# Patient Record
Sex: Female | Born: 1975 | Race: White | Hispanic: No | Marital: Married | Smoking: Never smoker
Health system: Southern US, Community
[De-identification: ages and names within clinical notes are randomized; demographics above are authoritative.]

## PROBLEM LIST (undated history)

## (undated) DIAGNOSIS — M329 Systemic lupus erythematosus, unspecified: Secondary | ICD-10-CM

## (undated) DIAGNOSIS — IMO0002 Reserved for concepts with insufficient information to code with codable children: Secondary | ICD-10-CM

## (undated) HISTORY — PX: ABDOMINAL SURGERY: SHX537

## (undated) HISTORY — PX: HERNIA REPAIR: SHX51

## (undated) HISTORY — PX: ABDOMINAL HYSTERECTOMY: SHX81

---

## 2019-08-04 HISTORY — PX: PACEMAKER IMPLANT: EP1218

## 2021-02-19 ENCOUNTER — Emergency Department (HOSPITAL_COMMUNITY): Payer: Medicare Other

## 2021-02-19 ENCOUNTER — Inpatient Hospital Stay (HOSPITAL_COMMUNITY)
Admission: EM | Admit: 2021-02-19 | Discharge: 2021-02-28 | DRG: 552 | Disposition: A | Discharge status: Home / Self Care | Payer: Medicare Other | Attending: Internal Medicine | Admitting: Internal Medicine

## 2021-02-19 ENCOUNTER — Encounter (HOSPITAL_COMMUNITY): Payer: Self-pay | Admitting: Emergency Medicine

## 2021-02-19 ENCOUNTER — Other Ambulatory Visit: Payer: Self-pay

## 2021-02-19 DIAGNOSIS — M25559 Pain in unspecified hip: Secondary | ICD-10-CM

## 2021-02-19 DIAGNOSIS — K219 Gastro-esophageal reflux disease without esophagitis: Secondary | ICD-10-CM

## 2021-02-19 DIAGNOSIS — E669 Obesity, unspecified: Secondary | ICD-10-CM | POA: Diagnosis present

## 2021-02-19 DIAGNOSIS — R8271 Bacteriuria: Secondary | ICD-10-CM | POA: Diagnosis present

## 2021-02-19 DIAGNOSIS — M4834 Traumatic spondylopathy, thoracic region: Secondary | ICD-10-CM | POA: Diagnosis present

## 2021-02-19 DIAGNOSIS — F250 Schizoaffective disorder, bipolar type: Secondary | ICD-10-CM

## 2021-02-19 DIAGNOSIS — D61818 Other pancytopenia: Secondary | ICD-10-CM | POA: Diagnosis present

## 2021-02-19 DIAGNOSIS — Z79899 Other long term (current) drug therapy: Secondary | ICD-10-CM

## 2021-02-19 DIAGNOSIS — Z885 Allergy status to narcotic agent status: Secondary | ICD-10-CM

## 2021-02-19 DIAGNOSIS — Z9109 Other allergy status, other than to drugs and biological substances: Secondary | ICD-10-CM

## 2021-02-19 DIAGNOSIS — Z20822 Contact with and (suspected) exposure to covid-19: Secondary | ICD-10-CM | POA: Diagnosis present

## 2021-02-19 DIAGNOSIS — R29898 Other symptoms and signs involving the musculoskeletal system: Secondary | ICD-10-CM | POA: Diagnosis present

## 2021-02-19 DIAGNOSIS — Z882 Allergy status to sulfonamides status: Secondary | ICD-10-CM

## 2021-02-19 DIAGNOSIS — R42 Dizziness and giddiness: Secondary | ICD-10-CM | POA: Diagnosis present

## 2021-02-19 DIAGNOSIS — Z6835 Body mass index (BMI) 35.0-35.9, adult: Secondary | ICD-10-CM

## 2021-02-19 DIAGNOSIS — M4836 Traumatic spondylopathy, lumbar region: Secondary | ICD-10-CM | POA: Diagnosis not present

## 2021-02-19 DIAGNOSIS — R45851 Suicidal ideations: Secondary | ICD-10-CM | POA: Diagnosis present

## 2021-02-19 DIAGNOSIS — Z880 Allergy status to penicillin: Secondary | ICD-10-CM

## 2021-02-19 DIAGNOSIS — R109 Unspecified abdominal pain: Secondary | ICD-10-CM

## 2021-02-19 DIAGNOSIS — W2103XA Struck by baseball, initial encounter: Secondary | ICD-10-CM

## 2021-02-19 DIAGNOSIS — T7491XA Unspecified adult maltreatment, confirmed, initial encounter: Secondary | ICD-10-CM | POA: Diagnosis present

## 2021-02-19 DIAGNOSIS — R001 Bradycardia, unspecified: Secondary | ICD-10-CM | POA: Diagnosis present

## 2021-02-19 DIAGNOSIS — F4310 Post-traumatic stress disorder, unspecified: Secondary | ICD-10-CM | POA: Diagnosis present

## 2021-02-19 DIAGNOSIS — M5116 Intervertebral disc disorders with radiculopathy, lumbar region: Secondary | ICD-10-CM | POA: Diagnosis present

## 2021-02-19 DIAGNOSIS — G8929 Other chronic pain: Secondary | ICD-10-CM | POA: Diagnosis present

## 2021-02-19 DIAGNOSIS — M4804 Spinal stenosis, thoracic region: Secondary | ICD-10-CM | POA: Diagnosis present

## 2021-02-19 DIAGNOSIS — R079 Chest pain, unspecified: Secondary | ICD-10-CM

## 2021-02-19 DIAGNOSIS — R7989 Other specified abnormal findings of blood chemistry: Secondary | ICD-10-CM | POA: Diagnosis present

## 2021-02-19 DIAGNOSIS — E1140 Type 2 diabetes mellitus with diabetic neuropathy, unspecified: Secondary | ICD-10-CM | POA: Diagnosis present

## 2021-02-19 DIAGNOSIS — I951 Orthostatic hypotension: Secondary | ICD-10-CM | POA: Diagnosis present

## 2021-02-19 DIAGNOSIS — T189XXD Foreign body of alimentary tract, part unspecified, subsequent encounter: Secondary | ICD-10-CM

## 2021-02-19 DIAGNOSIS — Z950 Presence of cardiac pacemaker: Secondary | ICD-10-CM | POA: Diagnosis present

## 2021-02-19 DIAGNOSIS — R0789 Other chest pain: Secondary | ICD-10-CM | POA: Diagnosis present

## 2021-02-19 DIAGNOSIS — T183XXA Foreign body in small intestine, initial encounter: Secondary | ICD-10-CM | POA: Diagnosis present

## 2021-02-19 DIAGNOSIS — S7002XA Contusion of left hip, initial encounter: Secondary | ICD-10-CM | POA: Diagnosis present

## 2021-02-19 DIAGNOSIS — Z9884 Bariatric surgery status: Secondary | ICD-10-CM

## 2021-02-19 DIAGNOSIS — T7411XA Adult physical abuse, confirmed, initial encounter: Secondary | ICD-10-CM | POA: Diagnosis present

## 2021-02-19 HISTORY — DX: Presence of cardiac pacemaker: Z95.0

## 2021-02-19 HISTORY — DX: Gastro-esophageal reflux disease without esophagitis: K21.9

## 2021-02-19 HISTORY — DX: Schizoaffective disorder, bipolar type: F25.0

## 2021-02-19 LAB — URINALYSIS, ROUTINE W REFLEX MICROSCOPIC
Bilirubin Urine: NEGATIVE
Glucose, UA: NEGATIVE mg/dL
Hgb urine dipstick: NEGATIVE
Ketones, ur: NEGATIVE mg/dL
Nitrite: NEGATIVE
Protein, ur: NEGATIVE mg/dL
Specific Gravity, Urine: 1.013 (ref 1.005–1.030)
WBC, UA: >50 WBC/hpf — ABNORMAL HIGH (ref 0–5)
pH: 5 (ref 5.0–8.0)

## 2021-02-19 LAB — CBC WITH DIFFERENTIAL/PLATELET
Abs Immature Granulocytes: 0.01 10*3/uL (ref 0.00–0.07)
Basophils Absolute: 0 10*3/uL (ref 0.0–0.1)
Basophils Relative: 0 %
Eosinophils Absolute: 0 10*3/uL (ref 0.0–0.5)
Eosinophils Relative: 1 %
HCT: 40.8 % (ref 36.0–46.0)
Hemoglobin: 12.8 g/dL (ref 12.0–15.0)
Immature Granulocytes: 0 %
Lymphocytes Relative: 30 %
Lymphs Abs: 1.6 10*3/uL (ref 0.7–4.0)
MCH: 30.4 pg (ref 26.0–34.0)
MCHC: 31.4 g/dL (ref 30.0–36.0)
MCV: 96.9 fL (ref 80.0–100.0)
Monocytes Absolute: 0.4 10*3/uL (ref 0.1–1.0)
Monocytes Relative: 8 %
Neutro Abs: 3.3 10*3/uL (ref 1.7–7.7)
Neutrophils Relative %: 61 %
Platelets: 209 10*3/uL (ref 150–400)
RBC: 4.21 MIL/uL (ref 3.87–5.11)
RDW: 12.8 % (ref 11.5–15.5)
WBC: 5.3 10*3/uL (ref 4.0–10.5)
nRBC: 0 % (ref 0.0–0.2)

## 2021-02-19 LAB — BASIC METABOLIC PANEL
Anion gap: 10 (ref 5–15)
BUN: 19 mg/dL (ref 6–20)
CO2: 20 mmol/L — ABNORMAL LOW (ref 22–32)
Calcium: 9 mg/dL (ref 8.9–10.3)
Chloride: 107 mmol/L (ref 98–111)
Creatinine, Ser: 1.46 mg/dL — ABNORMAL HIGH (ref 0.44–1.00)
GFR, Estimated: 44 mL/min — ABNORMAL LOW (ref >60)
Glucose, Bld: 100 mg/dL — ABNORMAL HIGH (ref 70–99)
Potassium: 4.5 mmol/L (ref 3.5–5.1)
Sodium: 137 mmol/L (ref 135–145)

## 2021-02-19 LAB — RAPID URINE DRUG SCREEN, HOSP PERFORMED
Amphetamines: NOT DETECTED
Barbiturates: NOT DETECTED
Benzodiazepines: NOT DETECTED
Cocaine: NOT DETECTED
Opiates: NOT DETECTED
Tetrahydrocannabinol: NOT DETECTED

## 2021-02-19 LAB — TROPONIN I (HIGH SENSITIVITY)
Troponin I (High Sensitivity): 3 ng/L (ref <18)
Troponin I (High Sensitivity): 3 ng/L (ref <18)

## 2021-02-19 LAB — D-DIMER, QUANTITATIVE: D-Dimer, Quant: 0.35 ug/mL-FEU (ref 0.00–0.50)

## 2021-02-19 MED ORDER — ACETAMINOPHEN 500 MG PO TABS
1000.0000 mg | ORAL_TABLET | Freq: Once | ORAL | Status: AC
  Administered 2021-02-19: 1000 mg via ORAL
  Filled 2021-02-19: qty 2

## 2021-02-19 MED ORDER — FENTANYL CITRATE (PF) 100 MCG/2ML IJ SOLN
100.0000 ug | Freq: Once | INTRAMUSCULAR | Status: AC
  Administered 2021-02-19: 100 ug via INTRAVENOUS
  Filled 2021-02-19: qty 2

## 2021-02-19 MED ORDER — SODIUM CHLORIDE 0.9 % IV BOLUS
1000.0000 mL | Freq: Once | INTRAVENOUS | Status: AC
  Administered 2021-02-19: 1000 mL via INTRAVENOUS

## 2021-02-19 MED ORDER — DIAZEPAM 5 MG/ML IJ SOLN
5.0000 mg | Freq: Once | INTRAMUSCULAR | Status: AC | PRN
  Administered 2021-02-20: 5 mg via INTRAVENOUS
  Filled 2021-02-19: qty 2

## 2021-02-19 MED ORDER — OXYCODONE-ACETAMINOPHEN 5-325 MG PO TABS
1.0000 | ORAL_TABLET | ORAL | Status: DC | PRN
  Administered 2021-02-19: 1 via ORAL
  Filled 2021-02-19: qty 1

## 2021-02-19 MED ORDER — HYDROMORPHONE HCL 1 MG/ML IJ SOLN
1.0000 mg | Freq: Once | INTRAMUSCULAR | Status: AC
Start: 2021-02-19 — End: 2021-02-19
  Administered 2021-02-19: 1 mg via INTRAVENOUS
  Filled 2021-02-19: qty 1

## 2021-02-19 MED ORDER — HYDROMORPHONE HCL 1 MG/ML IJ SOLN
1.0000 mg | INTRAMUSCULAR | Status: DC | PRN
  Administered 2021-02-20: 1 mg via INTRAVENOUS
  Filled 2021-02-19: qty 1
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
## 2021-02-19 NOTE — ED Notes (Signed)
Pt ambulated self from room to bathroom without assistance, requiring walker and stating "my left leg feels numb and tingly, and pain shoots up into my left hip when I put weight on it."

## 2021-02-19 NOTE — ED Triage Notes (Signed)
Patient states she was walking to the bus stop today and began having left leg numbness and central chest pain. Described pain as sharp and tight and is nonradiating.
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
## 2021-02-20 ENCOUNTER — Observation Stay (HOSPITAL_COMMUNITY): Payer: Medicare Other

## 2021-02-20 ENCOUNTER — Inpatient Hospital Stay (HOSPITAL_COMMUNITY): Payer: Medicare Other

## 2021-02-20 DIAGNOSIS — R42 Dizziness and giddiness: Secondary | ICD-10-CM | POA: Diagnosis present

## 2021-02-20 DIAGNOSIS — M25559 Pain in unspecified hip: Secondary | ICD-10-CM | POA: Diagnosis present

## 2021-02-20 DIAGNOSIS — S7002XA Contusion of left hip, initial encounter: Secondary | ICD-10-CM | POA: Diagnosis present

## 2021-02-20 DIAGNOSIS — E1140 Type 2 diabetes mellitus with diabetic neuropathy, unspecified: Secondary | ICD-10-CM | POA: Diagnosis present

## 2021-02-20 DIAGNOSIS — M4834 Traumatic spondylopathy, thoracic region: Secondary | ICD-10-CM | POA: Diagnosis present

## 2021-02-20 DIAGNOSIS — Z6835 Body mass index (BMI) 35.0-35.9, adult: Secondary | ICD-10-CM | POA: Diagnosis not present

## 2021-02-20 DIAGNOSIS — M5116 Intervertebral disc disorders with radiculopathy, lumbar region: Secondary | ICD-10-CM | POA: Diagnosis present

## 2021-02-20 DIAGNOSIS — G8929 Other chronic pain: Secondary | ICD-10-CM | POA: Diagnosis present

## 2021-02-20 DIAGNOSIS — M4836 Traumatic spondylopathy, lumbar region: Secondary | ICD-10-CM | POA: Diagnosis present

## 2021-02-20 DIAGNOSIS — R001 Bradycardia, unspecified: Secondary | ICD-10-CM | POA: Diagnosis present

## 2021-02-20 DIAGNOSIS — D61818 Other pancytopenia: Secondary | ICD-10-CM | POA: Diagnosis present

## 2021-02-20 DIAGNOSIS — Z9884 Bariatric surgery status: Secondary | ICD-10-CM | POA: Diagnosis not present

## 2021-02-20 DIAGNOSIS — R55 Syncope and collapse: Secondary | ICD-10-CM | POA: Diagnosis not present

## 2021-02-20 DIAGNOSIS — F250 Schizoaffective disorder, bipolar type: Secondary | ICD-10-CM | POA: Diagnosis present

## 2021-02-20 DIAGNOSIS — R29898 Other symptoms and signs involving the musculoskeletal system: Secondary | ICD-10-CM | POA: Diagnosis not present

## 2021-02-20 DIAGNOSIS — R45851 Suicidal ideations: Secondary | ICD-10-CM | POA: Diagnosis present

## 2021-02-20 DIAGNOSIS — Z79899 Other long term (current) drug therapy: Secondary | ICD-10-CM | POA: Diagnosis not present

## 2021-02-20 DIAGNOSIS — E669 Obesity, unspecified: Secondary | ICD-10-CM | POA: Diagnosis present

## 2021-02-20 DIAGNOSIS — Z950 Presence of cardiac pacemaker: Secondary | ICD-10-CM | POA: Diagnosis not present

## 2021-02-20 DIAGNOSIS — T7411XA Adult physical abuse, confirmed, initial encounter: Secondary | ICD-10-CM | POA: Diagnosis present

## 2021-02-20 DIAGNOSIS — Z20822 Contact with and (suspected) exposure to covid-19: Secondary | ICD-10-CM | POA: Diagnosis present

## 2021-02-20 DIAGNOSIS — R7989 Other specified abnormal findings of blood chemistry: Secondary | ICD-10-CM | POA: Diagnosis present

## 2021-02-20 DIAGNOSIS — T183XXA Foreign body in small intestine, initial encounter: Secondary | ICD-10-CM | POA: Diagnosis present

## 2021-02-20 DIAGNOSIS — M4804 Spinal stenosis, thoracic region: Secondary | ICD-10-CM | POA: Diagnosis present

## 2021-02-20 DIAGNOSIS — I951 Orthostatic hypotension: Secondary | ICD-10-CM | POA: Diagnosis present

## 2021-02-20 DIAGNOSIS — W2103XA Struck by baseball, initial encounter: Secondary | ICD-10-CM | POA: Diagnosis not present

## 2021-02-20 DIAGNOSIS — F4310 Post-traumatic stress disorder, unspecified: Secondary | ICD-10-CM | POA: Diagnosis present

## 2021-02-20 DIAGNOSIS — R0789 Other chest pain: Secondary | ICD-10-CM | POA: Diagnosis present

## 2021-02-20 LAB — CBC WITH DIFFERENTIAL/PLATELET
Abs Immature Granulocytes: 0.01 10*3/uL (ref 0.00–0.07)
Basophils Absolute: 0 10*3/uL (ref 0.0–0.1)
Basophils Relative: 0 %
Eosinophils Absolute: 0.1 10*3/uL (ref 0.0–0.5)
Eosinophils Relative: 1 %
HCT: 37.1 % (ref 36.0–46.0)
Hemoglobin: 12 g/dL (ref 12.0–15.0)
Immature Granulocytes: 0 %
Lymphocytes Relative: 38 %
Lymphs Abs: 1.7 10*3/uL (ref 0.7–4.0)
MCH: 30.3 pg (ref 26.0–34.0)
MCHC: 32.3 g/dL (ref 30.0–36.0)
MCV: 93.7 fL (ref 80.0–100.0)
Monocytes Absolute: 0.2 10*3/uL (ref 0.1–1.0)
Monocytes Relative: 4 %
Neutro Abs: 2.5 10*3/uL (ref 1.7–7.7)
Neutrophils Relative %: 57 %
Platelets: 149 10*3/uL — ABNORMAL LOW (ref 150–400)
RBC: 3.96 MIL/uL (ref 3.87–5.11)
RDW: 12.8 % (ref 11.5–15.5)
WBC: 4.4 10*3/uL (ref 4.0–10.5)
nRBC: 0 % (ref 0.0–0.2)

## 2021-02-20 LAB — COMPREHENSIVE METABOLIC PANEL
ALT: 111 U/L — ABNORMAL HIGH (ref 0–44)
AST: 204 U/L — ABNORMAL HIGH (ref 15–41)
Albumin: 3.7 g/dL (ref 3.5–5.0)
Alkaline Phosphatase: 99 U/L (ref 38–126)
Anion gap: 7 (ref 5–15)
BUN: 16 mg/dL (ref 6–20)
CO2: 24 mmol/L (ref 22–32)
Calcium: 8.5 mg/dL — ABNORMAL LOW (ref 8.9–10.3)
Chloride: 106 mmol/L (ref 98–111)
Creatinine, Ser: 1.16 mg/dL — ABNORMAL HIGH (ref 0.44–1.00)
GFR, Estimated: 59 mL/min — ABNORMAL LOW (ref >60)
Glucose, Bld: 113 mg/dL — ABNORMAL HIGH (ref 70–99)
Potassium: 3.7 mmol/L (ref 3.5–5.1)
Sodium: 137 mmol/L (ref 135–145)
Total Bilirubin: 0.7 mg/dL (ref 0.3–1.2)
Total Protein: 6.8 g/dL (ref 6.5–8.1)

## 2021-02-20 LAB — CK: Total CK: 114 U/L (ref 38–234)

## 2021-02-20 LAB — HEMOGLOBIN A1C
Hgb A1c MFr Bld: 5.8 % — ABNORMAL HIGH (ref 4.8–5.6)
Mean Plasma Glucose: 119.76 mg/dL

## 2021-02-20 LAB — SARS CORONAVIRUS 2 (TAT 6-24 HRS): SARS Coronavirus 2: NEGATIVE

## 2021-02-20 LAB — MAGNESIUM: Magnesium: 2.3 mg/dL (ref 1.7–2.4)

## 2021-02-20 LAB — RESP PANEL BY RT-PCR (FLU A&B, COVID) ARPGX2
Influenza A by PCR: NEGATIVE
Influenza B by PCR: NEGATIVE
SARS Coronavirus 2 by RT PCR: NEGATIVE

## 2021-02-20 LAB — HIV ANTIBODY (ROUTINE TESTING W REFLEX): HIV Screen 4th Generation wRfx: NONREACTIVE

## 2021-02-20 MED ORDER — OXYCODONE HCL 5 MG PO TABS
5.0000 mg | ORAL_TABLET | ORAL | Status: DC | PRN
  Administered 2021-02-21 – 2021-02-28 (×21): 5 mg via ORAL
  Filled 2021-02-20 (×22): qty 1

## 2021-02-20 MED ORDER — HYDROMORPHONE HCL 1 MG/ML IJ SOLN
1.0000 mg | INTRAMUSCULAR | Status: DC | PRN
  Administered 2021-02-20 – 2021-02-24 (×15): 1 mg via INTRAVENOUS
  Filled 2021-02-20 (×15): qty 1

## 2021-02-20 MED ORDER — ONDANSETRON HCL 4 MG/2ML IJ SOLN
4.0000 mg | Freq: Four times a day (QID) | INTRAMUSCULAR | Status: DC | PRN
  Administered 2021-02-23 – 2021-02-27 (×4): 4 mg via INTRAVENOUS
  Filled 2021-02-20 (×4): qty 2

## 2021-02-20 MED ORDER — ONDANSETRON HCL 4 MG PO TABS
4.0000 mg | ORAL_TABLET | Freq: Four times a day (QID) | ORAL | Status: DC | PRN
  Administered 2021-02-26: 4 mg via ORAL
  Filled 2021-02-20: qty 1

## 2021-02-20 MED ORDER — ENOXAPARIN SODIUM 40 MG/0.4ML IJ SOSY
40.0000 mg | PREFILLED_SYRINGE | Freq: Every day | INTRAMUSCULAR | Status: DC
  Administered 2021-02-20: 40 mg via SUBCUTANEOUS
  Filled 2021-02-20: qty 0.4

## 2021-02-20 MED ORDER — MIDODRINE HCL 5 MG PO TABS
2.5000 mg | ORAL_TABLET | Freq: Three times a day (TID) | ORAL | Status: DC
  Administered 2021-02-20 – 2021-02-21 (×4): 2.5 mg via ORAL
  Filled 2021-02-20 (×4): qty 1

## 2021-02-20 MED ORDER — DIAZEPAM 5 MG/ML IJ SOLN
2.5000 mg | Freq: Once | INTRAMUSCULAR | Status: DC | PRN
  Filled 2021-02-20: qty 2

## 2021-02-20 MED ORDER — LACTATED RINGERS IV SOLN: INTRAVENOUS | Status: DC

## 2021-02-20 MED ORDER — IOHEXOL 300 MG/ML  SOLN
100.0000 mL | Freq: Once | INTRAMUSCULAR | Status: AC | PRN
  Administered 2021-02-20: 100 mL via INTRAVENOUS

## 2021-02-20 MED ORDER — GABAPENTIN 300 MG PO CAPS
600.0000 mg | ORAL_CAPSULE | Freq: Three times a day (TID) | ORAL | Status: DC
  Administered 2021-02-20 (×2): 600 mg via ORAL
  Filled 2021-02-20 (×2): qty 2

## 2021-02-20 MED ORDER — ARIPIPRAZOLE 5 MG PO TABS
15.0000 mg | ORAL_TABLET | Freq: Every day | ORAL | Status: AC
  Administered 2021-02-20 – 2021-02-26 (×7): 15 mg via ORAL
  Filled 2021-02-20 (×9): qty 1

## 2021-02-20 MED ORDER — ACETAMINOPHEN 650 MG RE SUPP: 650.0000 mg | Freq: Four times a day (QID) | RECTAL | Status: DC | PRN

## 2021-02-20 MED ORDER — LACTATED RINGERS IV BOLUS
1000.0000 mL | Freq: Once | INTRAVENOUS | Status: AC
  Administered 2021-02-20: 1000 mL via INTRAVENOUS

## 2021-02-20 MED ORDER — LORAZEPAM 1 MG PO TABS
1.0000 mg | ORAL_TABLET | Freq: Every day | ORAL | Status: DC | PRN
  Administered 2021-02-23 – 2021-02-25 (×3): 1 mg via ORAL
  Filled 2021-02-20 (×3): qty 1

## 2021-02-20 MED ORDER — ALBUTEROL SULFATE (2.5 MG/3ML) 0.083% IN NEBU: 2.5000 mg | INHALATION_SOLUTION | Freq: Four times a day (QID) | RESPIRATORY_TRACT | Status: DC | PRN

## 2021-02-20 MED ORDER — LACTATED RINGERS IV SOLN: INTRAVENOUS | Status: AC

## 2021-02-20 MED ORDER — POLYETHYLENE GLYCOL 3350 17 G PO PACK: 17.0000 g | PACK | Freq: Every day | ORAL | Status: DC | PRN

## 2021-02-20 MED ORDER — FAMOTIDINE 20 MG PO TABS
20.0000 mg | ORAL_TABLET | Freq: Two times a day (BID) | ORAL | Status: DC
  Administered 2021-02-20 – 2021-02-28 (×17): 20 mg via ORAL
  Filled 2021-02-20 (×17): qty 1

## 2021-02-20 MED ORDER — ACETAMINOPHEN 325 MG PO TABS
650.0000 mg | ORAL_TABLET | Freq: Four times a day (QID) | ORAL | Status: DC | PRN
  Administered 2021-02-20: 650 mg via ORAL
  Filled 2021-02-20: qty 2

## 2021-02-20 MED ORDER — GABAPENTIN 300 MG PO CAPS
900.0000 mg | ORAL_CAPSULE | Freq: Three times a day (TID) | ORAL | Status: DC
  Administered 2021-02-20 – 2021-02-21 (×3): 900 mg via ORAL
  Filled 2021-02-20 (×3): qty 3

## 2021-02-20 NOTE — ED Notes (Addendum)
Pt transported to CT ?
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
## 2021-02-20 NOTE — ED Notes (Signed)
Placed Breakfast Order 

## 2021-02-20 NOTE — ED Notes (Signed)
MD Lowell Guitar paged for B/P systolic in 80's.
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
## 2021-02-21 ENCOUNTER — Inpatient Hospital Stay (HOSPITAL_COMMUNITY): Payer: Medicare Other

## 2021-02-21 DIAGNOSIS — R55 Syncope and collapse: Secondary | ICD-10-CM | POA: Diagnosis not present

## 2021-02-21 LAB — CBC WITH DIFFERENTIAL/PLATELET
Abs Immature Granulocytes: 0.01 10*3/uL (ref 0.00–0.07)
Basophils Absolute: 0 10*3/uL (ref 0.0–0.1)
Basophils Relative: 0 %
Eosinophils Absolute: 0.1 10*3/uL (ref 0.0–0.5)
Eosinophils Relative: 4 %
HCT: 33.5 % — ABNORMAL LOW (ref 36.0–46.0)
Hemoglobin: 11.2 g/dL — ABNORMAL LOW (ref 12.0–15.0)
Immature Granulocytes: 0 %
Lymphocytes Relative: 53 %
Lymphs Abs: 1.5 10*3/uL (ref 0.7–4.0)
MCH: 30.9 pg (ref 26.0–34.0)
MCHC: 33.4 g/dL (ref 30.0–36.0)
MCV: 92.5 fL (ref 80.0–100.0)
Monocytes Absolute: 0.3 10*3/uL (ref 0.1–1.0)
Monocytes Relative: 10 %
Neutro Abs: 1 10*3/uL — ABNORMAL LOW (ref 1.7–7.7)
Neutrophils Relative %: 33 %
Platelets: 148 10*3/uL — ABNORMAL LOW (ref 150–400)
RBC: 3.62 MIL/uL — ABNORMAL LOW (ref 3.87–5.11)
RDW: 12.9 % (ref 11.5–15.5)
WBC: 2.9 10*3/uL — ABNORMAL LOW (ref 4.0–10.5)
nRBC: 0 % (ref 0.0–0.2)

## 2021-02-21 LAB — COMPREHENSIVE METABOLIC PANEL
ALT: 102 U/L — ABNORMAL HIGH (ref 0–44)
AST: 78 U/L — ABNORMAL HIGH (ref 15–41)
Albumin: 3.3 g/dL — ABNORMAL LOW (ref 3.5–5.0)
Alkaline Phosphatase: 98 U/L (ref 38–126)
Anion gap: 5 (ref 5–15)
BUN: 12 mg/dL (ref 6–20)
CO2: 27 mmol/L (ref 22–32)
Calcium: 8.7 mg/dL — ABNORMAL LOW (ref 8.9–10.3)
Chloride: 107 mmol/L (ref 98–111)
Creatinine, Ser: 1.04 mg/dL — ABNORMAL HIGH (ref 0.44–1.00)
GFR, Estimated: >60 mL/min (ref >60)
Glucose, Bld: 91 mg/dL (ref 70–99)
Potassium: 4.2 mmol/L (ref 3.5–5.1)
Sodium: 139 mmol/L (ref 135–145)
Total Bilirubin: 0.6 mg/dL (ref 0.3–1.2)
Total Protein: 6.2 g/dL — ABNORMAL LOW (ref 6.5–8.1)

## 2021-02-21 LAB — URINALYSIS, ROUTINE W REFLEX MICROSCOPIC
Bacteria, UA: NONE SEEN
Bilirubin Urine: NEGATIVE
Glucose, UA: NEGATIVE mg/dL
Hgb urine dipstick: NEGATIVE
Ketones, ur: NEGATIVE mg/dL
Nitrite: NEGATIVE
Protein, ur: NEGATIVE mg/dL
Specific Gravity, Urine: 1.012 (ref 1.005–1.030)
pH: 5 (ref 5.0–8.0)

## 2021-02-21 LAB — MAGNESIUM: Magnesium: 2.2 mg/dL (ref 1.7–2.4)

## 2021-02-21 LAB — ECHOCARDIOGRAM COMPLETE
Area-P 1/2: 2.91 cm2
Height: 69 in
S' Lateral: 3.5 cm
Weight: 3856 oz

## 2021-02-21 LAB — PHOSPHORUS: Phosphorus: 4.5 mg/dL (ref 2.5–4.6)

## 2021-02-21 LAB — VITAMIN B12: Vitamin B-12: 187 pg/mL (ref 180–914)

## 2021-02-21 MED ORDER — GABAPENTIN 300 MG PO CAPS
600.0000 mg | ORAL_CAPSULE | Freq: Three times a day (TID) | ORAL | Status: DC
  Administered 2021-02-21 – 2021-02-28 (×21): 600 mg via ORAL
  Filled 2021-02-21 (×21): qty 2

## 2021-02-21 MED ORDER — PANTOPRAZOLE SODIUM 40 MG PO TBEC
40.0000 mg | DELAYED_RELEASE_TABLET | Freq: Every day | ORAL | Status: DC
  Administered 2021-02-21 – 2021-02-28 (×8): 40 mg via ORAL
  Filled 2021-02-21 (×8): qty 1

## 2021-02-21 MED ORDER — MIDODRINE HCL 5 MG PO TABS
5.0000 mg | ORAL_TABLET | Freq: Two times a day (BID) | ORAL | Status: DC
  Administered 2021-02-21 – 2021-02-28 (×14): 5 mg via ORAL
  Filled 2021-02-21 (×14): qty 1

## 2021-02-21 MED ORDER — NAPROXEN 250 MG PO TABS
250.0000 mg | ORAL_TABLET | Freq: Two times a day (BID) | ORAL | Status: DC
  Administered 2021-02-21 – 2021-02-28 (×14): 250 mg via ORAL
  Filled 2021-02-21 (×15): qty 1

## 2021-02-21 MED ORDER — DIAZEPAM 5 MG/ML IJ SOLN
2.5000 mg | Freq: Once | INTRAMUSCULAR | Status: AC | PRN
  Administered 2021-02-21: 2.5 mg via INTRAVENOUS
  Filled 2021-02-21: qty 2
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
## 2021-02-21 NOTE — Progress Notes (Signed)
Per order, Changed device settings for MRI to  DOO at 75 bpm  Will program device back to pre-MRI settings after completion of exam, and send transmission. 
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
## 2021-02-21 NOTE — Progress Notes (Signed)
  Echocardiogram 2D Echocardiogram has been performed.  Kristin Elliott G Julia Kulzer 02/21/2021, 10:36 AM
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
## 2021-02-22 MED ORDER — CYANOCOBALAMIN 1000 MCG/ML IJ SOLN
1000.0000 ug | INTRAMUSCULAR | Status: DC
  Administered 2021-02-22: 1000 ug via INTRAMUSCULAR
  Filled 2021-02-22: qty 1
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
## 2021-02-22 NOTE — Progress Notes (Signed)
Pt called RN into the room and stated that her right leg Is beginning to feel numb just like her left leg and is painful. RN notified MD Via secure chat will follow up on how to proceed.

## 2021-02-22 NOTE — Progress Notes (Signed)
Post Fall Vitals
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
## 2021-02-22 NOTE — Progress Notes (Signed)
RN walked into pts room to assess and administer morning medications and the patient stated that when she sat up she felt very dizzy and woozy also that the room was spinning. VS taken and in flowsheet pt BP low at 94/54 but pt has been running soft will notify MD for further instructions.
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
## 2021-02-23 ENCOUNTER — Encounter (HOSPITAL_COMMUNITY): Payer: Self-pay | Admitting: Family Medicine

## 2021-02-23 MED ORDER — METHYLPREDNISOLONE 4 MG PO TBPK
8.0000 mg | ORAL_TABLET | Freq: Every evening | ORAL | Status: AC
  Administered 2021-02-24: 8 mg via ORAL

## 2021-02-23 MED ORDER — METHYLPREDNISOLONE 4 MG PO TBPK
8.0000 mg | ORAL_TABLET | Freq: Every morning | ORAL | Status: AC
  Administered 2021-02-23: 8 mg via ORAL
  Filled 2021-02-23: qty 21

## 2021-02-23 MED ORDER — METHYLPREDNISOLONE 4 MG PO TBPK
4.0000 mg | ORAL_TABLET | ORAL | Status: AC
  Administered 2021-02-23: 4 mg via ORAL

## 2021-02-23 MED ORDER — METHYLPREDNISOLONE 4 MG PO TBPK
4.0000 mg | ORAL_TABLET | Freq: Four times a day (QID) | ORAL | Status: DC
  Administered 2021-02-25 – 2021-02-27 (×9): 4 mg via ORAL

## 2021-02-23 MED ORDER — DEXAMETHASONE 4 MG PO TABS
4.0000 mg | ORAL_TABLET | Freq: Three times a day (TID) | ORAL | Status: DC
  Filled 2021-02-23 (×2): qty 1

## 2021-02-23 MED ORDER — METHYLPREDNISOLONE 4 MG PO TBPK
8.0000 mg | ORAL_TABLET | Freq: Every evening | ORAL | Status: AC
  Administered 2021-02-23: 8 mg via ORAL

## 2021-02-23 MED ORDER — METHYLPREDNISOLONE 4 MG PO TBPK
4.0000 mg | ORAL_TABLET | Freq: Three times a day (TID) | ORAL | Status: AC
  Administered 2021-02-24 (×3): 4 mg via ORAL

## 2021-02-23 MED ORDER — METHOCARBAMOL 500 MG PO TABS
500.0000 mg | ORAL_TABLET | Freq: Four times a day (QID) | ORAL | Status: DC | PRN
  Administered 2021-02-23 – 2021-02-28 (×4): 500 mg via ORAL
  Filled 2021-02-23 (×4): qty 1
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
## 2021-02-24 NOTE — Progress Notes (Signed)
Patient tearful stating 'I'm very depressed and I want to talk to someone." Writer asked if patient was having any suicidal thoughts and patient responded "yes, I just don't want to hurt myself." MD Jomarie Longs paged. New orders for suicide sitter and psych consult.
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
## 2021-02-24 NOTE — Progress Notes (Signed)
Staffing was contacted for sitter. Night charge RN made aware.

## 2021-02-24 NOTE — Progress Notes (Signed)
Patient continues to refuse her cardiac monitor, MD notified. No new orders at this time. MD suggested patient continues with the cardiac monitor due to her medical history.

## 2021-02-25 DIAGNOSIS — R45851 Suicidal ideations: Secondary | ICD-10-CM

## 2021-02-25 MED ORDER — ARIPIPRAZOLE ER 400 MG IM SRER
400.0000 mg | INTRAMUSCULAR | Status: DC
  Administered 2021-02-25: 400 mg via INTRAMUSCULAR
  Filled 2021-02-25: qty 2
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
## 2021-02-25 NOTE — Progress Notes (Addendum)
Pt requesting to leave AMA. Pt safety sitter order was discontinued at first shift. Confirmed with patient she has no suicidal intents or thoughts. Order written by provider she is low risk. Pt already completed [facility] consult and is aware of recommendations. Pt is in a stable calm mood. Pt states " I'm tired of being in the hospital and I found someone that will let me stay with them." Dr. Toniann Fail made aware. Pt states she is trying to find a ride before she leaves. Advised patient to stay until discharged. Pt is considering staying.
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
## 2021-02-26 ENCOUNTER — Inpatient Hospital Stay (HOSPITAL_COMMUNITY): Payer: Medicare Other

## 2021-02-26 MED ORDER — METHYLPREDNISOLONE 4 MG PO TABS: 4.0000 mg | ORAL_TABLET | Freq: Every day | ORAL | 0 refills | Status: DC

## 2021-02-26 MED ORDER — LORAZEPAM 0.5 MG PO TABS: 0.5000 mg | ORAL_TABLET | Freq: Every day | ORAL | 0 refills | Status: DC | PRN

## 2021-02-26 MED ORDER — METHOCARBAMOL 500 MG PO TABS: 500.0000 mg | ORAL_TABLET | Freq: Three times a day (TID) | ORAL | Status: DC

## 2021-02-26 MED ORDER — POLYETHYLENE GLYCOL 3350 17 G PO PACK: 17.0000 g | PACK | Freq: Every day | ORAL | 0 refills | Status: DC | PRN

## 2021-02-26 MED ORDER — ENOXAPARIN SODIUM 40 MG/0.4ML IJ SOSY
40.0000 mg | PREFILLED_SYRINGE | INTRAMUSCULAR | Status: DC
  Administered 2021-02-26 – 2021-02-28 (×3): 40 mg via SUBCUTANEOUS
  Filled 2021-02-26 (×3): qty 0.4

## 2021-02-26 MED ORDER — OXYCODONE HCL 5 MG PO TABS: 5.0000 mg | ORAL_TABLET | Freq: Four times a day (QID) | ORAL | 0 refills | Status: DC | PRN

## 2021-02-26 MED ORDER — MIDODRINE HCL 5 MG PO TABS: 5.0000 mg | ORAL_TABLET | Freq: Two times a day (BID) | ORAL | Status: DC
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
## 2021-02-26 NOTE — Progress Notes (Signed)
Pt refusing to have bed alarm on after she reported passing out earlier today.
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
## 2021-02-26 NOTE — Progress Notes (Signed)
t c/o swallowing the metal piece of her tongue ring and it made her nauseous and she had one ep of emesis. Then pt across the room reported to RN that pt sit down then lay on the floor. VSS. she is now c/o abd pain. Patient is back in bed with alarm activated. Pt appears upset by this alarm protocol. RN explained reasons behind bed alarm. MD notified of situation.

## 2021-02-26 NOTE — Discharge Summary (Signed)
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   Collection Time: 02/20/21  1:13 PM   Specimen: Nasopharyngeal Swab; Nasopharyngeal(NP) swabs in vial transport medium  Result Value Ref Range Status   SARS Coronavirus 2 by RT PCR NEGATIVE NEGATIVE Final    Comment: (NOTE) SARS-CoV-2 target nucleic acids are NOT DETECTED.  The SARS-CoV-2 RNA is generally detectable in upper respiratory specimens during the acute phase of infection. The lowest concentration of SARS-CoV-2 viral copies this assay can detect is 138 copies/mL. A negative result does not preclude SARS-Cov-2 infection and should not be used as the sole basis for treatment or other patient management decisions. A negative result may occur with  improper specimen collection/handling, submission of specimen other than nasopharyngeal swab, presence of viral mutation(s) within the areas targeted by this assay, and inadequate number of viral copies(<138 copies/mL). A negative result must be combined  with clinical observations, patient history, and epidemiological information. The expected result is Negative.  Fact Sheet for Patients:  BloggerCourse.com  Fact Sheet for Healthcare Providers:  SeriousBroker.it  This test is no t yet approved or cleared by the [place] FDA and  has been authorized for detection and/or diagnosis of SARS-CoV-2 by FDA under an  Emergency Use Authorization (EUA). This EUA will remain  in effect (meaning this test can be used) for the duration of the COVID-19 declaration under Section 564(b)(1) of the Act, 21 U.S.C.section 360bbb-3(b)(1), unless the authorization is terminated  or revoked sooner.       Influenza A by PCR NEGATIVE NEGATIVE Final   Influenza B by PCR NEGATIVE NEGATIVE Final    Comment: (NOTE) The Xpert Xpress SARS-CoV-2/FLU/RSV plus assay is intended as an aid in the diagnosis of influenza from Nasopharyngeal swab specimens and should not be used as a sole basis for treatment. Nasal washings and aspirates are unacceptable for Xpert Xpress SARS-CoV-2/FLU/RSV testing.  Fact Sheet for Patients: BloggerCourse.com  Fact Sheet for Healthcare Providers: SeriousBroker.it  This test is not yet approved or cleared by the [place] FDA and has been authorized for detection and/or diagnosis of SARS-CoV-2 by FDA under an Emergency Use Authorization (EUA). This EUA will remain in effect (meaning this test can be used) for the duration of the COVID-19 declaration under Section 564(b)(1) of the Act, 21 U.S.C. section 360bbb-3(b)(1), unless the authorization is terminated or revoked.  Performed at [facility], [address] [place], [place] [address]      Labs: Basic Metabolic Panel: Recent Labs  Lab 02/19/21 1409 02/20/21 0327 02/21/21 0406  NA 137 137 139  K 4.5 3.7 4.2  CL 107 106 107  CO2 20* 24 27  GLUCOSE 100*  113* 91  BUN CREATININE 1.46* 1.16* 1.04*  CALCIUM 9.0 8.5* 8.7*  MG  --  2.3 2.2  PHOS  --   --  4.5   Liver Function Tests: Recent Labs  Lab 02/20/21 0327 02/21/21 0406  AST 204* 78*  ALT 111* 102*  ALKPHOS 99 98  BILITOT 0.7 0.6  PROT 6.8 6.2*  ALBUMIN 3.7 3.3*   No results for input(s): LIPASE, AMYLASE in the last 168 hours. No results for input(s): AMMONIA in the last 168 hours. CBC: Recent Labs  Lab 02/19/21 1409 02/20/21 0327 02/21/21 0406  WBC 5.3 4.4 2.9*  NEUTROABS 3.3 2.5 1.0*  HGB 12.8 12.0 11.2*  HCT 40.8 37.1 33.5*  MCV 96.9 93.7 92.5  PLT 209 149* 148*   Cardiac Enzymes: Recent Labs  Lab 02/20/21 1300  CKTOTAL 114   BNP: BNP (last 3 results) No results for input(s): BNP in the last 8760 hours.  ProBNP (last 3 results) No results for input(s): PROBNP in the last 8760 hours.  CBG: No results for input(s): GLUCAP in the last 168 hours.     Signed:  [name] MD.  [facility] 02/26/2021, 1:40 PM

## 2021-02-26 NOTE — TOC Progression Note (Signed)
Transition of Care [facility]) - Progression Note    Patient Details  Name: Kristin Elliott MRN: [id number] Date of Birth: 05-19-1976  Transition of Care [facility]) CM/SW [place], [place] Phone Number: 02/26/2021, 2:11 PM  Clinical Narrative:     CSW met with pt at bedside. Pt gave CSW name of a facility in [place]. CSW informed pt that [facility] would only be able to transport her with a payment upfront. Pt stated she is unable to do that. CSW gave pt bed offer of [place] care. Pt stated she will accept it. CSW requested rapid covid test.  2:16pm- [place] withdrew offer. CSW asked MD to cancel covid test at this time since pt has no other offers.       Expected Discharge Plan and Services                                                 Social Determinants of Health (SDOH) Interventions    Readmission Risk Interventions No flowsheet data found.  Emeterio Reeve, Latanya Presser, [place] Social Worker [phone]

## 2021-02-27 ENCOUNTER — Other Ambulatory Visit (HOSPITAL_COMMUNITY): Payer: Self-pay

## 2021-02-27 MED ORDER — OXYCODONE HCL 5 MG PO TABS
5.0000 mg | ORAL_TABLET | Freq: Four times a day (QID) | ORAL | 0 refills | Status: AC | PRN
  Filled 2021-02-27: qty 30, 7d supply, fill #0

## 2021-02-27 MED ORDER — METHYLPREDNISOLONE 4 MG PO TABS
4.0000 mg | ORAL_TABLET | Freq: Every day | ORAL | 0 refills | Status: AC
  Filled 2021-02-27: qty 1, 1d supply, fill #0

## 2021-02-27 MED ORDER — OXYCODONE HCL 5 MG PO TABS
5.0000 mg | ORAL_TABLET | Freq: Once | ORAL | Status: AC
  Administered 2021-02-27: 5 mg via ORAL

## 2021-02-27 MED ORDER — METHOCARBAMOL 500 MG PO TABS
500.0000 mg | ORAL_TABLET | Freq: Three times a day (TID) | ORAL | 0 refills | Status: DC
  Filled 2021-02-27: qty 90, 30d supply, fill #0

## 2021-02-27 MED ORDER — MIDODRINE HCL 5 MG PO TABS
5.0000 mg | ORAL_TABLET | Freq: Two times a day (BID) | ORAL | 0 refills | Status: AC
  Filled 2021-02-27: qty 60, 30d supply, fill #0

## 2021-02-27 MED ORDER — POLYETHYLENE GLYCOL 3350 17 G PO PACK
17.0000 g | PACK | Freq: Every day | ORAL | Status: DC
  Administered 2021-02-27: 17 g via ORAL
  Filled 2021-02-27 (×2): qty 1

## 2021-02-27 NOTE — Care Management Important Message (Signed)
Important Message  Patient Details  Name: Kristin Elliott MRN: [id number] Date of Birth: [birth year]   Medicare Important Message Given:  Yes     Adison Jerger P Trinaty Bundrick 02/27/2021, 1:29 PM

## 2021-02-27 NOTE — Progress Notes (Signed)
This chaplain joined the Pt., notary, and witnesses for the notarizing of the Pt. Advance Directive: HCPOA and Living Will.  The Pt. chose Vonna Drafts as the healthcare agent. If this person is unwilling or unable to serve the Pt. second choice is Marlana Salvage.  The chaplain gave the original AD to the Pt. along with one copy. A copy was scanned to the Pt. EMR and to Marlana Salvage at [email or web address].  This chaplain is available for F/U spiritual care as needed.
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
## 2021-02-28 ENCOUNTER — Other Ambulatory Visit (HOSPITAL_COMMUNITY): Payer: Self-pay

## 2021-02-28 ENCOUNTER — Inpatient Hospital Stay (HOSPITAL_COMMUNITY): Payer: Medicare Other

## 2021-02-28 LAB — CBC
HCT: 36.6 % (ref 36.0–46.0)
Hemoglobin: 12.1 g/dL (ref 12.0–15.0)
MCH: 30.7 pg (ref 26.0–34.0)
MCHC: 33.1 g/dL (ref 30.0–36.0)
MCV: 92.9 fL (ref 80.0–100.0)
Platelets: 176 10*3/uL (ref 150–400)
RBC: 3.94 MIL/uL (ref 3.87–5.11)
RDW: 12.8 % (ref 11.5–15.5)
WBC: 6.9 10*3/uL (ref 4.0–10.5)
nRBC: 0 % (ref 0.0–0.2)

## 2021-02-28 LAB — BASIC METABOLIC PANEL
Anion gap: 8 (ref 5–15)
BUN: 17 mg/dL (ref 6–20)
CO2: 28 mmol/L (ref 22–32)
Calcium: 8.9 mg/dL (ref 8.9–10.3)
Chloride: 102 mmol/L (ref 98–111)
Creatinine, Ser: 1.17 mg/dL — ABNORMAL HIGH (ref 0.44–1.00)
GFR, Estimated: 58 mL/min — ABNORMAL LOW (ref >60)
Glucose, Bld: 88 mg/dL (ref 70–99)
Potassium: 5 mmol/L (ref 3.5–5.1)
Sodium: 138 mmol/L (ref 135–145)

## 2021-02-28 NOTE — Progress Notes (Signed)
RN gave pt discharge paperwork and she stated understanding. Py provided 2 bus passes for her transport and medications have been escribed to home pharmacy. Pt packed all her belongings

## 2021-02-28 NOTE — Progress Notes (Signed)
This chaplains passes the Pt. walking in the hall outside her room. The chaplain met the Pt. yesterday during the completion the Pt. AD.  The Pt. shares with the chaplain she is feeling numbness and tingling in her leg. The chaplain encourages the Pt. to return to her room and sit down. The chaplain listens reflectively as the Pt. discusses her day and plans for discharge. The Pt. makes the decision to rest and the chaplain leaves the room. 
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
## 2021-02-28 NOTE — TOC Progression Note (Addendum)
Transition of Care [facility]) - Progression Note    Patient Details  Name: Kristin Elliott MRN: [id number] Date of Birth: [birth year]  [facility] CM/SW Contact  Jimmy Picket, [place] Phone Number: 02/28/2021, 11:15 AM  Clinical Narrative:     CSW talked to pt at bedside. Pt reports she has a friend who has agreed for her to stay with them. Pt reports she needs to take a bus to [place] and friend will pick her up from there. Pt reports she has the money to buy her ticket in her account but needs to get to the bank and then to the bus station.   CSW informed pt that we can give her two bus passes and will print out a bus schedule for her. Bus passes and another copy of bus schedule will be on chart for when pt is ready to DC.   Expected Discharge Plan: Skilled Nursing Facility Barriers to Discharge: Financial Resources, Continued Medical Work up, Homeless with medical needs, Unsafe home situation, SNF Pending bed offer, Family Issues  Expected Discharge Plan and Services Expected Discharge Plan: Skilled Nursing Facility       Living arrangements for the past 2 months: No permanent address                                       Social Determinants of Health (SDOH) Interventions    Readmission Risk Interventions No flowsheet data found.  Jimmy Picket, Theresia Majors, [place] Clinical Social Worker [phone]

## 2021-02-28 NOTE — Progress Notes (Signed)
Pt left for CT scan

## 2021-02-28 NOTE — Discharge Summary (Signed)
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 RA Volume:   44.60 ml  19.94 ml/m LA Vol (A4C):   49.1 ml 21.95 ml/m LA Biplane Vol: 60.5 ml 27.05 ml/m  AORTIC VALVE LVOT Vmax:   92.30 cm/s LVOT Vmean:  59.800 cm/s LVOT VTI:    0.216 m  AORTA Ao Root diam: 3.20 cm Ao Asc diam:  3.30 cm MITRAL VALVE MV Area (PHT): 2.91 cm    SHUNTS MV Decel Time: 261 msec    Systemic VTI:  0.22 m MV E velocity: 85.70 cm/s  Systemic Diam: 2.30 cm MV A velocity: 70.70 cm/s MV E/A ratio:  1.21 Jodelle Red MD Electronically signed by Jodelle Red MD Signature Date/Time: 02/21/2021/2:05:34 PM    Final    DG HIP UNILAT WITH PELVIS 2-3 VIEWS LEFT  Result Date: 02/20/2021 CLINICAL DATA:  Left hip pain EXAM: DG HIP (WITH OR WITHOUT PELVIS) 2-3V LEFT COMPARISON:  None. FINDINGS: There is no evidence of hip fracture or dislocation. There is no evidence of arthropathy or other focal bone abnormality. IMPRESSION: Negative. Electronically  Signed   By: Deatra Robinson M.D.   On: 02/20/2021 01:27      Subjective: She is feeling well, no worsening abdominal pain, she has chronic abdominal pain form her hiatal hernia, not related to swallowing piercing.  She feels comfortable been discharge from hospital. She understand she will need X ray in 2 days.   Discharge Exam: Vitals:   02/28/21 0451 02/28/21 1223  BP: 115/72 119/77  Pulse: 62 66  Resp: 18 16  Temp: 98.1 F (36.7 C) (!) 97.4 F (36.3 C)  SpO2: 100% 100%     General: Pt is alert, awake, not in acute distress Cardiovascular: RRR, S1/S2 +, no rubs, no gallops Respiratory: CTA bilaterally, no wheezing, no rhonchi Abdominal: Soft, NT, ND, bowel sounds + Extremities: no edema, no cyanosis    The results of significant diagnostics from this hospitalization (including imaging, microbiology, ancillary and laboratory) are listed below for reference.     Microbiology: Recent Results (from the past 240 hour(s))  SARS CORONAVIRUS 2 (TAT 6-24 HRS) Nasopharyngeal Nasopharyngeal Swab     Status: None   Collection Time: 02/19/21  9:30 PM   Specimen: Nasopharyngeal Swab  Result Value Ref Range Status   SARS Coronavirus 2 NEGATIVE NEGATIVE Final    Comment: (NOTE) SARS-CoV-2 target nucleic acids are NOT DETECTED.  The SARS-CoV-2 RNA is generally detectable in upper and lower respiratory specimens during the acute phase of infection. Negative results do not preclude SARS-CoV-2 infection, do not rule out co-infections with other pathogens, and should not be used as the sole basis for treatment or other patient management decisions. Negative results must be combined with clinical observations, patient history, and epidemiological information. The expected result is Negative.  Fact Sheet for Patients: HairSlick.no  Fact Sheet for Healthcare Providers: quierodirigir.com  This test is not yet approved or cleared by the [place] FDA and  has been authorized for  detection and/or diagnosis of SARS-CoV-2 by FDA under an Emergency Use Authorization (EUA). This EUA will remain  in effect (meaning this test can be used) for the duration of the COVID-19 declaration under Se ction 564(b)(1) of the Act, 21 U.S.C. section 360bbb-3(b)(1), unless the authorization is terminated or revoked sooner.  Performed at [facility], [address], [place], [place] [address]   Resp Panel by RT-PCR (Flu A&B, Covid) Nasopharyngeal Swab     Status: None   Collection Time: 02/20/21  1:13 PM   Specimen: Nasopharyngeal Swab; Nasopharyngeal(NP) swabs in vial transport medium  Result Value Ref Range Status   SARS Coronavirus 2 by RT PCR NEGATIVE NEGATIVE Final    Comment: (NOTE) SARS-CoV-2 target nucleic acids are NOT DETECTED.  The SARS-CoV-2 RNA is generally detectable in upper respiratory specimens during the acute phase of infection. The lowest concentration of SARS-CoV-2 viral copies this assay can detect is 138 copies/mL. A negative result does not preclude SARS-Cov-2 infection and should not be used as the sole basis for treatment or other patient management decisions. A negative result may occur with  improper specimen collection/handling, submission of specimen other than nasopharyngeal swab, presence of viral mutation(s) within the areas targeted by this assay, and inadequate number of viral copies(<138 copies/mL). A negative result must be combined with clinical observations, patient history, and epidemiological information. The expected result is Negative.  Fact Sheet for Patients:  BloggerCourse.com  Fact Sheet for Healthcare Providers:  SeriousBroker.it  This test is no t yet approved or cleared by the [place] and  has been authorized for detection and/or diagnosis of SARS-CoV-2 by FDA under an Emergency Use Authorization (EUA). This EUA will remain  in effect (meaning this test can  be used) for the duration of the COVID-19 declaration under Section 564(b)(1) of the Act, 21 U.S.C.section 360bbb-3(b)(1), unless the authorization is terminated  or revoked sooner.       Influenza A by PCR NEGATIVE NEGATIVE Final   Influenza B by PCR NEGATIVE NEGATIVE Final    Comment: (NOTE) The Xpert Xpress SARS-CoV-2/FLU/RSV plus assay is intended as an aid in the diagnosis of influenza from Nasopharyngeal swab specimens and should not be used as a sole basis for treatment. Nasal washings and aspirates are unacceptable for Xpert Xpress SARS-CoV-2/FLU/RSV testing.  Fact Sheet for Patients: BloggerCourse.com  Fact Sheet for Healthcare Providers: SeriousBroker.it  This test is not yet approved or cleared by the [place] FDA and has been authorized for detection and/or diagnosis of SARS-CoV-2 by FDA under an Emergency Use Authorization (EUA). This EUA will remain in effect (meaning this test can be used) for the duration of the COVID-19 declaration under Section 564(b)(1) of the Act, 21 U.S.C. section 360bbb-3(b)(1), unless the authorization is terminated or revoked.  Performed at [facility], [address] [place], [place] [address]      Labs: BNP (last 3 results) No results for input(s): BNP in the last 8760 hours. Basic Metabolic Panel: Recent Labs  Lab 02/28/21 0427  NA 138  K 5.0  CL 102  CO2 28  GLUCOSE 88  BUN 17  CREATININE 1.17*  CALCIUM 8.9   Liver Function Tests: No results for input(s): AST, ALT, ALKPHOS, BILITOT, PROT, ALBUMIN in the last 168 hours. No results for input(s): LIPASE, AMYLASE in the last 168 hours. No results for input(s): AMMONIA in the last 168 hours. CBC: Recent Labs  Lab 02/28/21 0427  WBC 6.9  HGB 12.1  HCT 36.6  MCV 92.9  PLT 176   Cardiac Enzymes: No results for input(s): CKTOTAL, CKMB, CKMBINDEX, TROPONINI in the last 168 hours. BNP: Invalid input(s):  POCBNP CBG: No results for input(s): GLUCAP in the last 168 hours. D-Dimer No results for input(s): DDIMER in the last 72 hours. Hgb A1c No results for input(s): HGBA1C in the last 72 hours. Lipid Profile No results for input(s): CHOL, HDL, LDLCALC, TRIG, CHOLHDL, LDLDIRECT in the last 72 hours. Thyroid function studies No results for input(s): TSH, T4TOTAL, T3FREE, THYROIDAB in the last 72 hours.  Invalid input(s): FREET3 Anemia work up No results for input(s): VITAMINB12, FOLATE, FERRITIN, TIBC, IRON, RETICCTPCT in the last 72 hours. Urinalysis    Component Value Date/Time   COLORURINE STRAW (A) 02/21/2021 0235   APPEARANCEUR CLEAR 02/21/2021 0235   LABSPEC 1.012 02/21/2021 0235   PHURINE 5.0 02/21/2021 0235   GLUCOSEU NEGATIVE 02/21/2021 0235   HGBUR NEGATIVE 02/21/2021 0235   BILIRUBINUR NEGATIVE 02/21/2021 0235   KETONESUR NEGATIVE 02/21/2021 0235   PROTEINUR NEGATIVE 02/21/2021 0235   NITRITE NEGATIVE 02/21/2021 0235   LEUKOCYTESUR SMALL (A) 02/21/2021 0235   Sepsis Labs Invalid input(s): PROCALCITONIN,  WBC,  LACTICIDVEN Microbiology Recent Results (from the past 240 hour(s))  SARS CORONAVIRUS 2 (TAT 6-24 HRS) Nasopharyngeal Nasopharyngeal Swab     Status: None   Collection Time: 02/19/21  9:30 PM   Specimen: Nasopharyngeal Swab  Result Value Ref Range Status   SARS Coronavirus 2 NEGATIVE NEGATIVE Final    Comment: (NOTE) SARS-CoV-2 target nucleic acids are NOT DETECTED.  The SARS-CoV-2 RNA  is generally detectable in upper and lower respiratory specimens during the acute phase of infection. Negative results do not preclude SARS-CoV-2 infection, do not rule out co-infections with other pathogens, and should not be used as the sole basis for treatment or other patient management decisions. Negative results must be combined with clinical observations, patient history, and epidemiological information. The expected result is Negative.  Fact Sheet for  Patients: HairSlick.no  Fact Sheet for Healthcare Providers: quierodirigir.com  This test is not yet approved or cleared by the [place] FDA and  has been authorized for detection and/or diagnosis of SARS-CoV-2 by FDA under an Emergency Use Authorization (EUA). This EUA will remain  in effect (meaning this test can be used) for the duration of the COVID-19 declaration under Se ction 564(b)(1) of the Act, 21 U.S.C. section 360bbb-3(b)(1), unless the authorization is terminated or revoked sooner.  Performed at [facility], [address] [place], [place] [address]   Resp Panel by RT-PCR (Flu A&B, Covid) Nasopharyngeal Swab     Status: None   Collection Time: 02/20/21  1:13 PM   Specimen: Nasopharyngeal Swab; Nasopharyngeal(NP) swabs in vial transport medium  Result Value Ref Range Status   SARS Coronavirus 2 by RT PCR NEGATIVE NEGATIVE Final    Comment: (NOTE) SARS-CoV-2 target nucleic acids are NOT DETECTED.  The SARS-CoV-2 RNA is generally detectable in upper respiratory specimens during the acute phase of infection. The lowest concentration of SARS-CoV-2 viral copies this assay can detect is 138 copies/mL. A negative result does not preclude SARS-Cov-2 infection and should not be used as the sole basis for treatment or other patient management decisions. A negative result may occur with  improper specimen collection/handling, submission of specimen other than nasopharyngeal swab, presence of viral mutation(s) within the areas targeted by this assay, and inadequate number of viral copies(<138 copies/mL). A negative result must be combined with clinical observations, patient history, and epidemiological information. The expected result is Negative.  Fact Sheet for Patients:  BloggerCourse.com  Fact Sheet for Healthcare Providers:  SeriousBroker.it  This test is  no t yet approved or cleared by the [place] FDA and  has been authorized for detection and/or diagnosis of SARS-CoV-2 by FDA under an Emergency Use Authorization (EUA). This EUA will remain  in effect (meaning this test can be used) for the duration of the COVID-19 declaration under Section 564(b)(1) of the Act, 21 U.S.C.section 360bbb-3(b)(1), unless the authorization is terminated  or revoked sooner.       Influenza A by PCR NEGATIVE NEGATIVE Final   Influenza B by PCR NEGATIVE NEGATIVE Final    Comment: (NOTE) The Xpert Xpress SARS-CoV-2/FLU/RSV plus assay is intended as an aid in the diagnosis of influenza from Nasopharyngeal swab specimens and should not be used as a sole basis for treatment. Nasal washings and aspirates are unacceptable for Xpert Xpress SARS-CoV-2/FLU/RSV testing.  Fact Sheet for Patients: BloggerCourse.com  Fact Sheet for Healthcare Providers: SeriousBroker.it  This test is not yet approved or cleared by the [place] FDA and has been authorized for detection and/or diagnosis of SARS-CoV-2 by FDA under an Emergency Use Authorization (EUA). This EUA will remain in effect (meaning this test can be used) for the duration of the COVID-19 declaration under Section 564(b)(1) of the Act, 21 U.S.C. section 360bbb-3(b)(1), unless the authorization is terminated or revoked.  Performed at [facility], [address], [place], [place] [address]      Time coordinating discharge: 40 minutes  SIGNED:   Alba CoryBelkys A Marvin Maenza, MD  [facility]

## 2021-03-01 ENCOUNTER — Encounter (HOSPITAL_COMMUNITY): Payer: Self-pay

## 2021-03-01 ENCOUNTER — Emergency Department (HOSPITAL_COMMUNITY)
Admission: EM | Admit: 2021-03-01 | Discharge: 2021-03-01 | Disposition: A | Discharge status: Home / Self Care | Payer: Medicare Other | Attending: Emergency Medicine | Admitting: Emergency Medicine

## 2021-03-01 ENCOUNTER — Emergency Department (HOSPITAL_COMMUNITY): Payer: Medicare Other

## 2021-03-01 ENCOUNTER — Other Ambulatory Visit: Payer: Self-pay

## 2021-03-01 DIAGNOSIS — M79662 Pain in left lower leg: Secondary | ICD-10-CM | POA: Diagnosis not present

## 2021-03-01 DIAGNOSIS — R200 Anesthesia of skin: Secondary | ICD-10-CM | POA: Insufficient documentation

## 2021-03-01 DIAGNOSIS — R55 Syncope and collapse: Secondary | ICD-10-CM | POA: Diagnosis not present

## 2021-03-01 DIAGNOSIS — X58XXXA Exposure to other specified factors, initial encounter: Secondary | ICD-10-CM | POA: Insufficient documentation

## 2021-03-01 DIAGNOSIS — R109 Unspecified abdominal pain: Secondary | ICD-10-CM

## 2021-03-01 DIAGNOSIS — R112 Nausea with vomiting, unspecified: Secondary | ICD-10-CM | POA: Diagnosis not present

## 2021-03-01 DIAGNOSIS — Z79899 Other long term (current) drug therapy: Secondary | ICD-10-CM | POA: Diagnosis not present

## 2021-03-01 DIAGNOSIS — Z950 Presence of cardiac pacemaker: Secondary | ICD-10-CM | POA: Insufficient documentation

## 2021-03-01 DIAGNOSIS — T184XXA Foreign body in colon, initial encounter: Secondary | ICD-10-CM | POA: Diagnosis present

## 2021-03-01 LAB — CBC WITH DIFFERENTIAL/PLATELET
Abs Immature Granulocytes: 0.09 10*3/uL — ABNORMAL HIGH (ref 0.00–0.07)
Basophils Absolute: 0 10*3/uL (ref 0.0–0.1)
Basophils Relative: 0 %
Eosinophils Absolute: 0.1 10*3/uL (ref 0.0–0.5)
Eosinophils Relative: 1 %
HCT: 35.6 % — ABNORMAL LOW (ref 36.0–46.0)
Hemoglobin: 11.5 g/dL — ABNORMAL LOW (ref 12.0–15.0)
Immature Granulocytes: 1 %
Lymphocytes Relative: 20 %
Lymphs Abs: 1.6 10*3/uL (ref 0.7–4.0)
MCH: 30.3 pg (ref 26.0–34.0)
MCHC: 32.3 g/dL (ref 30.0–36.0)
MCV: 93.9 fL (ref 80.0–100.0)
Monocytes Absolute: 0.6 10*3/uL (ref 0.1–1.0)
Monocytes Relative: 7 %
Neutro Abs: 5.5 10*3/uL (ref 1.7–7.7)
Neutrophils Relative %: 71 %
Platelets: 209 10*3/uL (ref 150–400)
RBC: 3.79 MIL/uL — ABNORMAL LOW (ref 3.87–5.11)
RDW: 13.2 % (ref 11.5–15.5)
WBC: 7.9 10*3/uL (ref 4.0–10.5)
nRBC: 0 % (ref 0.0–0.2)

## 2021-03-01 LAB — COMPREHENSIVE METABOLIC PANEL
ALT: 52 U/L — ABNORMAL HIGH (ref 0–44)
AST: 35 U/L (ref 15–41)
Albumin: 3.9 g/dL (ref 3.5–5.0)
Alkaline Phosphatase: 110 U/L (ref 38–126)
Anion gap: 6 (ref 5–15)
BUN: 25 mg/dL — ABNORMAL HIGH (ref 6–20)
CO2: 28 mmol/L (ref 22–32)
Calcium: 8.4 mg/dL — ABNORMAL LOW (ref 8.9–10.3)
Chloride: 105 mmol/L (ref 98–111)
Creatinine, Ser: 1.08 mg/dL — ABNORMAL HIGH (ref 0.44–1.00)
GFR, Estimated: >60 mL/min (ref >60)
Glucose, Bld: 95 mg/dL (ref 70–99)
Potassium: 4.5 mmol/L (ref 3.5–5.1)
Sodium: 139 mmol/L (ref 135–145)
Total Bilirubin: 0.5 mg/dL (ref 0.3–1.2)
Total Protein: 7 g/dL (ref 6.5–8.1)

## 2021-03-01 LAB — I-STAT BETA HCG BLOOD, ED (MC, WL, AP ONLY): I-stat hCG, quantitative: <5 m[IU]/mL (ref <5)

## 2021-03-01 LAB — LIPASE, BLOOD: Lipase: 43 U/L (ref 11–51)

## 2021-03-01 MED ORDER — SODIUM CHLORIDE 0.9 % IV BOLUS
1000.0000 mL | Freq: Once | INTRAVENOUS | Status: AC
  Administered 2021-03-01: 1000 mL via INTRAVENOUS

## 2021-03-01 MED ORDER — SODIUM CHLORIDE (PF) 0.9 % IJ SOLN
INTRAMUSCULAR | Status: AC
  Filled 2021-03-01: qty 50

## 2021-03-01 MED ORDER — ONDANSETRON HCL 4 MG/2ML IJ SOLN
4.0000 mg | Freq: Once | INTRAMUSCULAR | Status: AC
  Administered 2021-03-01: 4 mg via INTRAVENOUS
  Filled 2021-03-01: qty 2

## 2021-03-01 MED ORDER — ONDANSETRON 4 MG PO TBDP: 4.0000 mg | ORAL_TABLET | Freq: Three times a day (TID) | ORAL | 0 refills | Status: DC | PRN

## 2021-03-01 MED ORDER — DICYCLOMINE HCL 20 MG PO TABS: 20.0000 mg | ORAL_TABLET | Freq: Two times a day (BID) | ORAL | 0 refills | Status: DC

## 2021-03-01 MED ORDER — OXYCODONE HCL 5 MG PO TABS
10.0000 mg | ORAL_TABLET | Freq: Once | ORAL | Status: AC
  Administered 2021-03-01: 10 mg via ORAL
  Filled 2021-03-01: qty 2

## 2021-03-01 MED ORDER — HYDROMORPHONE HCL 1 MG/ML IJ SOLN
0.5000 mg | Freq: Once | INTRAMUSCULAR | Status: AC
  Administered 2021-03-01: 0.5 mg via INTRAVENOUS
  Filled 2021-03-01: qty 1

## 2021-03-01 MED ORDER — IOHEXOL 350 MG/ML SOLN
80.0000 mL | Freq: Once | INTRAVENOUS | Status: AC | PRN
  Administered 2021-03-01: 80 mL via INTRAVENOUS

## 2021-03-01 NOTE — ED Notes (Signed)
Pacemaker interrogated and data sent successfully per [employer] iPad.

## 2021-03-01 NOTE — ED Notes (Signed)
Patient asking for pain medication. Dr. Dalene Seltzer notified.

## 2021-03-01 NOTE — ED Notes (Signed)
Attempted to collect urine specimen; pt states they cannot provide a sample at this time.
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
## 2021-03-01 NOTE — ED Triage Notes (Signed)
[place]/o swallowed tongue ring and abdominal pain after BM.  BP 140/70 HR 62 RR 18 Sp02 100 RA CBG 120

## 2021-03-02 ENCOUNTER — Emergency Department (HOSPITAL_COMMUNITY)
Admission: EM | Admit: 2021-03-02 | Discharge: 2021-03-02 | Disposition: A | Discharge status: Home / Self Care | Payer: Medicare Other | Attending: Emergency Medicine | Admitting: Emergency Medicine

## 2021-03-02 ENCOUNTER — Encounter (HOSPITAL_COMMUNITY): Payer: Self-pay

## 2021-03-02 ENCOUNTER — Other Ambulatory Visit: Payer: Self-pay

## 2021-03-02 DIAGNOSIS — M79605 Pain in left leg: Secondary | ICD-10-CM | POA: Insufficient documentation

## 2021-03-02 DIAGNOSIS — R109 Unspecified abdominal pain: Secondary | ICD-10-CM | POA: Diagnosis not present

## 2021-03-02 DIAGNOSIS — Z950 Presence of cardiac pacemaker: Secondary | ICD-10-CM | POA: Insufficient documentation
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
## 2021-03-02 NOTE — ED Triage Notes (Addendum)
Patient is homeless. Was just discharged a few hours ago and was waiting in the lobby for social work to see her in the morning. Patient said her legs just hurt so bad from sitting there. She said she was "not happy with the doctor earlier for discharging her because her legs are still aching even after a bowel movement it just made everything hurt." Patient said after the dilaudid she felt better but then it just wore off.
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
## 2021-03-02 NOTE — ED Notes (Signed)
Pt provided with Ginger ale and crackers at this time.

## 2021-03-24 ENCOUNTER — Encounter (HOSPITAL_COMMUNITY): Payer: Self-pay | Admitting: Emergency Medicine

## 2021-03-24 ENCOUNTER — Emergency Department (HOSPITAL_COMMUNITY): Payer: Medicare (Managed Care)

## 2021-03-24 ENCOUNTER — Other Ambulatory Visit: Payer: Self-pay

## 2021-03-24 ENCOUNTER — Emergency Department (HOSPITAL_COMMUNITY)
Admission: EM | Admit: 2021-03-24 | Discharge: 2021-03-24 | Disposition: A | Discharge status: Home / Self Care | Payer: Medicare (Managed Care) | Attending: Emergency Medicine | Admitting: Emergency Medicine

## 2021-03-24 DIAGNOSIS — Z950 Presence of cardiac pacemaker: Secondary | ICD-10-CM | POA: Insufficient documentation

## 2021-03-24 DIAGNOSIS — H538 Other visual disturbances: Secondary | ICD-10-CM | POA: Insufficient documentation

## 2021-03-24 DIAGNOSIS — Z8669 Personal history of other diseases of the nervous system and sense organs: Secondary | ICD-10-CM | POA: Insufficient documentation

## 2021-03-24 DIAGNOSIS — R519 Headache, unspecified: Secondary | ICD-10-CM | POA: Insufficient documentation

## 2021-03-24 DIAGNOSIS — R42 Dizziness and giddiness: Secondary | ICD-10-CM | POA: Insufficient documentation

## 2021-03-24 DIAGNOSIS — R112 Nausea with vomiting, unspecified: Secondary | ICD-10-CM | POA: Diagnosis not present

## 2021-03-24 DIAGNOSIS — R202 Paresthesia of skin: Secondary | ICD-10-CM | POA: Diagnosis not present

## 2021-03-24 LAB — CBC
HCT: 34.4 % — ABNORMAL LOW (ref 36.0–46.0)
Hemoglobin: 11.3 g/dL — ABNORMAL LOW (ref 12.0–15.0)
MCH: 30.4 pg (ref 26.0–34.0)
MCHC: 32.8 g/dL (ref 30.0–36.0)
MCV: 92.5 fL (ref 80.0–100.0)
Platelets: 212 10*3/uL (ref 150–400)
RBC: 3.72 MIL/uL — ABNORMAL LOW (ref 3.87–5.11)
RDW: 13.2 % (ref 11.5–15.5)
WBC: 5.5 10*3/uL (ref 4.0–10.5)
nRBC: 0 % (ref 0.0–0.2)

## 2021-03-24 LAB — BASIC METABOLIC PANEL
Anion gap: 7 (ref 5–15)
BUN: 12 mg/dL (ref 6–20)
CO2: 23 mmol/L (ref 22–32)
Calcium: 8.7 mg/dL — ABNORMAL LOW (ref 8.9–10.3)
Chloride: 110 mmol/L (ref 98–111)
Creatinine, Ser: 1.2 mg/dL — ABNORMAL HIGH (ref 0.44–1.00)
GFR, Estimated: 56 mL/min — ABNORMAL LOW (ref >60)
Glucose, Bld: 115 mg/dL — ABNORMAL HIGH (ref 70–99)
Potassium: 3.4 mmol/L — ABNORMAL LOW (ref 3.5–5.1)
Sodium: 140 mmol/L (ref 135–145)

## 2021-03-24 MED ORDER — DROPERIDOL 2.5 MG/ML IJ SOLN
2.5000 mg | Freq: Once | INTRAMUSCULAR | Status: AC
  Administered 2021-03-24: 2.5 mg via INTRAVENOUS
  Filled 2021-03-24: qty 2

## 2021-03-24 MED ORDER — SODIUM CHLORIDE 0.9 % IV BOLUS
1000.0000 mL | Freq: Once | INTRAVENOUS | Status: AC
  Administered 2021-03-24: 1000 mL via INTRAVENOUS

## 2021-03-24 MED ORDER — HYDROMORPHONE HCL 1 MG/ML IJ SOLN
0.5000 mg | Freq: Once | INTRAMUSCULAR | Status: AC
  Administered 2021-03-24: 0.5 mg via INTRAVENOUS
  Filled 2021-03-24: qty 1

## 2021-03-24 MED ORDER — DIPHENHYDRAMINE HCL 50 MG/ML IJ SOLN
25.0000 mg | Freq: Once | INTRAMUSCULAR | Status: AC
  Administered 2021-03-24: 25 mg via INTRAVENOUS
  Filled 2021-03-24: qty 1
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
## 2021-03-24 NOTE — Discharge Instructions (Addendum)

## 2021-03-26 ENCOUNTER — Other Ambulatory Visit: Payer: Self-pay

## 2021-03-26 ENCOUNTER — Observation Stay (HOSPITAL_COMMUNITY)
Admission: EM | Admit: 2021-03-26 | Discharge: 2021-03-28 | Disposition: A | Discharge status: Home / Self Care | Payer: Medicare (Managed Care) | Attending: Internal Medicine | Admitting: Internal Medicine

## 2021-03-26 ENCOUNTER — Emergency Department (HOSPITAL_COMMUNITY): Payer: Medicare (Managed Care)

## 2021-03-26 DIAGNOSIS — Z1389 Encounter for screening for other disorder: Secondary | ICD-10-CM

## 2021-03-26 DIAGNOSIS — R079 Chest pain, unspecified: Secondary | ICD-10-CM

## 2021-03-26 DIAGNOSIS — G629 Polyneuropathy, unspecified: Secondary | ICD-10-CM | POA: Insufficient documentation

## 2021-03-26 DIAGNOSIS — N760 Acute vaginitis: Secondary | ICD-10-CM | POA: Diagnosis not present

## 2021-03-26 DIAGNOSIS — H538 Other visual disturbances: Secondary | ICD-10-CM | POA: Insufficient documentation

## 2021-03-26 DIAGNOSIS — Z79899 Other long term (current) drug therapy: Secondary | ICD-10-CM | POA: Insufficient documentation

## 2021-03-26 DIAGNOSIS — M47812 Spondylosis without myelopathy or radiculopathy, cervical region: Secondary | ICD-10-CM | POA: Insufficient documentation

## 2021-03-26 DIAGNOSIS — Z789 Other specified health status: Secondary | ICD-10-CM

## 2021-03-26 DIAGNOSIS — F319 Bipolar disorder, unspecified: Secondary | ICD-10-CM | POA: Insufficient documentation

## 2021-03-26 DIAGNOSIS — R531 Weakness: Secondary | ICD-10-CM | POA: Insufficient documentation

## 2021-03-26 DIAGNOSIS — Z882 Allergy status to sulfonamides status: Secondary | ICD-10-CM | POA: Insufficient documentation

## 2021-03-26 DIAGNOSIS — Z880 Allergy status to penicillin: Secondary | ICD-10-CM | POA: Insufficient documentation

## 2021-03-26 DIAGNOSIS — G8929 Other chronic pain: Secondary | ICD-10-CM | POA: Diagnosis not present

## 2021-03-26 DIAGNOSIS — K449 Diaphragmatic hernia without obstruction or gangrene: Secondary | ICD-10-CM | POA: Diagnosis not present

## 2021-03-26 DIAGNOSIS — M4802 Spinal stenosis, cervical region: Secondary | ICD-10-CM | POA: Insufficient documentation

## 2021-03-26 DIAGNOSIS — E860 Dehydration: Secondary | ICD-10-CM | POA: Diagnosis not present

## 2021-03-26 DIAGNOSIS — Z881 Allergy status to other antibiotic agents status: Secondary | ICD-10-CM | POA: Insufficient documentation

## 2021-03-26 DIAGNOSIS — E236 Other disorders of pituitary gland: Secondary | ICD-10-CM | POA: Insufficient documentation

## 2021-03-26 DIAGNOSIS — R202 Paresthesia of skin: Secondary | ICD-10-CM | POA: Diagnosis not present

## 2021-03-26 DIAGNOSIS — E876 Hypokalemia: Secondary | ICD-10-CM | POA: Diagnosis not present

## 2021-03-26 DIAGNOSIS — R930 Abnormal findings on diagnostic imaging of skull and head, not elsewhere classified: Secondary | ICD-10-CM | POA: Diagnosis not present

## 2021-03-26 DIAGNOSIS — E039 Hypothyroidism, unspecified: Secondary | ICD-10-CM | POA: Diagnosis not present

## 2021-03-26 DIAGNOSIS — R0789 Other chest pain: Principal | ICD-10-CM | POA: Insufficient documentation

## 2021-03-26 DIAGNOSIS — Z950 Presence of cardiac pacemaker: Secondary | ICD-10-CM | POA: Diagnosis not present

## 2021-03-26 DIAGNOSIS — F419 Anxiety disorder, unspecified: Secondary | ICD-10-CM | POA: Insufficient documentation

## 2021-03-26 DIAGNOSIS — R519 Headache, unspecified: Secondary | ICD-10-CM | POA: Diagnosis not present

## 2021-03-26 DIAGNOSIS — R9431 Abnormal electrocardiogram [ECG] [EKG]: Secondary | ICD-10-CM | POA: Diagnosis not present

## 2021-03-26 DIAGNOSIS — F250 Schizoaffective disorder, bipolar type: Secondary | ICD-10-CM | POA: Diagnosis not present

## 2021-03-26 DIAGNOSIS — Z86711 Personal history of pulmonary embolism: Secondary | ICD-10-CM | POA: Diagnosis not present

## 2021-03-26 DIAGNOSIS — Z20822 Contact with and (suspected) exposure to covid-19: Secondary | ICD-10-CM | POA: Insufficient documentation

## 2021-03-26 DIAGNOSIS — N179 Acute kidney failure, unspecified: Secondary | ICD-10-CM | POA: Diagnosis not present

## 2021-03-26 DIAGNOSIS — Z885 Allergy status to narcotic agent status: Secondary | ICD-10-CM | POA: Insufficient documentation

## 2021-03-26 DIAGNOSIS — R61 Generalized hyperhidrosis: Secondary | ICD-10-CM | POA: Insufficient documentation

## 2021-03-26 DIAGNOSIS — R2689 Other abnormalities of gait and mobility: Secondary | ICD-10-CM | POA: Diagnosis not present

## 2021-03-26 DIAGNOSIS — K219 Gastro-esophageal reflux disease without esophagitis: Secondary | ICD-10-CM | POA: Insufficient documentation

## 2021-03-26 DIAGNOSIS — M50222 Other cervical disc displacement at C5-C6 level: Secondary | ICD-10-CM | POA: Diagnosis not present

## 2021-03-26 DIAGNOSIS — R29898 Other symptoms and signs involving the musculoskeletal system: Secondary | ICD-10-CM | POA: Insufficient documentation

## 2021-03-26 DIAGNOSIS — I700 Atherosclerosis of aorta: Secondary | ICD-10-CM | POA: Insufficient documentation

## 2021-03-26 DIAGNOSIS — I9589 Other hypotension: Secondary | ICD-10-CM | POA: Insufficient documentation

## 2021-03-26 DIAGNOSIS — R29818 Other symptoms and signs involving the nervous system: Secondary | ICD-10-CM | POA: Insufficient documentation

## 2021-03-26 DIAGNOSIS — Z888 Allergy status to other drugs, medicaments and biological substances status: Secondary | ICD-10-CM | POA: Insufficient documentation

## 2021-03-26 DIAGNOSIS — R112 Nausea with vomiting, unspecified: Secondary | ICD-10-CM

## 2021-03-26 LAB — BASIC METABOLIC PANEL
Anion gap: 7 (ref 5–15)
BUN: 15 mg/dL (ref 6–20)
CO2: 25 mmol/L (ref 22–32)
Calcium: 8.9 mg/dL (ref 8.9–10.3)
Chloride: 109 mmol/L (ref 98–111)
Creatinine, Ser: 1.03 mg/dL — ABNORMAL HIGH (ref 0.44–1.00)
GFR, Estimated: >60 mL/min (ref >60)
Glucose, Bld: 97 mg/dL (ref 70–99)
Potassium: 3.7 mmol/L (ref 3.5–5.1)
Sodium: 141 mmol/L (ref 135–145)

## 2021-03-26 LAB — CBC WITH DIFFERENTIAL/PLATELET
Abs Immature Granulocytes: 0.01 10*3/uL (ref 0.00–0.07)
Basophils Absolute: 0 10*3/uL (ref 0.0–0.1)
Basophils Relative: 0 %
Eosinophils Absolute: 0.1 10*3/uL (ref 0.0–0.5)
Eosinophils Relative: 2 %
HCT: 34.8 % — ABNORMAL LOW (ref 36.0–46.0)
Hemoglobin: 11.3 g/dL — ABNORMAL LOW (ref 12.0–15.0)
Immature Granulocytes: 0 %
Lymphocytes Relative: 30 %
Lymphs Abs: 1.2 10*3/uL (ref 0.7–4.0)
MCH: 30 pg (ref 26.0–34.0)
MCHC: 32.5 g/dL (ref 30.0–36.0)
MCV: 92.3 fL (ref 80.0–100.0)
Monocytes Absolute: 0.3 10*3/uL (ref 0.1–1.0)
Monocytes Relative: 8 %
Neutro Abs: 2.4 10*3/uL (ref 1.7–7.7)
Neutrophils Relative %: 60 %
Platelets: 205 10*3/uL (ref 150–400)
RBC: 3.77 MIL/uL — ABNORMAL LOW (ref 3.87–5.11)
RDW: 13.3 % (ref 11.5–15.5)
WBC: 4 10*3/uL (ref 4.0–10.5)
nRBC: 0 % (ref 0.0–0.2)

## 2021-03-26 LAB — TROPONIN I (HIGH SENSITIVITY)
Troponin I (High Sensitivity): 3 ng/L (ref <18)
Troponin I (High Sensitivity): 3 ng/L (ref <18)

## 2021-03-26 LAB — RESP PANEL BY RT-PCR (FLU A&B, COVID) ARPGX2
Influenza A by PCR: NEGATIVE
Influenza B by PCR: NEGATIVE
SARS Coronavirus 2 by RT PCR: NEGATIVE

## 2021-03-26 LAB — I-STAT BETA HCG BLOOD, ED (MC, WL, AP ONLY): I-stat hCG, quantitative: <5 m[IU]/mL (ref <5)

## 2021-03-26 MED ORDER — FENTANYL CITRATE (PF) 100 MCG/2ML IJ SOLN
12.5000 ug | INTRAMUSCULAR | Status: DC | PRN
Start: 2021-03-26 — End: 2021-03-29
  Administered 2021-03-26 – 2021-03-28 (×7): 12.5 ug via INTRAVENOUS
  Filled 2021-03-26 (×7): qty 2

## 2021-03-26 MED ORDER — ASPIRIN EC 81 MG PO TBEC
81.0000 mg | DELAYED_RELEASE_TABLET | Freq: Every day | ORAL | Status: DC
  Administered 2021-03-27 – 2021-03-28 (×2): 81 mg via ORAL
  Filled 2021-03-26 (×2): qty 1

## 2021-03-26 MED ORDER — DIPHENHYDRAMINE HCL 50 MG/ML IJ SOLN
25.0000 mg | Freq: Once | INTRAMUSCULAR | Status: AC
  Administered 2021-03-26: 25 mg via INTRAVENOUS
  Filled 2021-03-26: qty 1

## 2021-03-26 MED ORDER — LORAZEPAM 1 MG PO TABS
2.0000 mg | ORAL_TABLET | Freq: Two times a day (BID) | ORAL | Status: DC | PRN
  Administered 2021-03-26 – 2021-03-27 (×2): 2 mg via ORAL
  Filled 2021-03-26 (×2): qty 2

## 2021-03-26 MED ORDER — ONDANSETRON 4 MG PO TBDP
4.0000 mg | ORAL_TABLET | Freq: Once | ORAL | Status: AC
  Administered 2021-03-26: 4 mg via ORAL
  Filled 2021-03-26: qty 1

## 2021-03-26 MED ORDER — ALBUTEROL SULFATE HFA 108 (90 BASE) MCG/ACT IN AERS
2.0000 | INHALATION_SPRAY | Freq: Four times a day (QID) | RESPIRATORY_TRACT | Status: DC | PRN
  Filled 2021-03-26: qty 6.7

## 2021-03-26 MED ORDER — ACETAMINOPHEN 325 MG PO TABS
650.0000 mg | ORAL_TABLET | Freq: Four times a day (QID) | ORAL | Status: DC | PRN
  Administered 2021-03-26: 650 mg via ORAL
  Filled 2021-03-26: qty 2

## 2021-03-26 MED ORDER — ONDANSETRON HCL 4 MG PO TABS: 4.0000 mg | ORAL_TABLET | Freq: Four times a day (QID) | ORAL | Status: DC | PRN

## 2021-03-26 MED ORDER — ENOXAPARIN SODIUM 40 MG/0.4ML IJ SOSY
40.0000 mg | PREFILLED_SYRINGE | INTRAMUSCULAR | Status: DC
  Administered 2021-03-26 – 2021-03-27 (×2): 40 mg via SUBCUTANEOUS
  Filled 2021-03-26 (×2): qty 0.4

## 2021-03-26 MED ORDER — ONDANSETRON HCL 4 MG/2ML IJ SOLN
4.0000 mg | Freq: Four times a day (QID) | INTRAMUSCULAR | Status: DC | PRN
  Administered 2021-03-26 – 2021-03-27 (×2): 4 mg via INTRAVENOUS
  Filled 2021-03-26 (×2): qty 2

## 2021-03-26 MED ORDER — PROCHLORPERAZINE EDISYLATE 10 MG/2ML IJ SOLN
10.0000 mg | Freq: Once | INTRAMUSCULAR | Status: AC
  Administered 2021-03-26: 10 mg via INTRAVENOUS
  Filled 2021-03-26: qty 2

## 2021-03-26 MED ORDER — DOCUSATE SODIUM 100 MG PO CAPS
100.0000 mg | ORAL_CAPSULE | Freq: Two times a day (BID) | ORAL | Status: DC
  Administered 2021-03-26 – 2021-03-28 (×4): 100 mg via ORAL
  Filled 2021-03-26 (×4): qty 1

## 2021-03-26 MED ORDER — LACTATED RINGERS IV BOLUS
1000.0000 mL | Freq: Once | INTRAVENOUS | Status: AC
  Administered 2021-03-26: 1000 mL via INTRAVENOUS

## 2021-03-26 MED ORDER — MIDODRINE HCL 5 MG PO TABS
5.0000 mg | ORAL_TABLET | Freq: Two times a day (BID) | ORAL | Status: DC
  Administered 2021-03-27 – 2021-03-28 (×4): 5 mg via ORAL
  Filled 2021-03-26 (×3): qty 1

## 2021-03-26 MED ORDER — ACETAMINOPHEN 650 MG RE SUPP: 650.0000 mg | Freq: Four times a day (QID) | RECTAL | Status: DC | PRN

## 2021-03-26 MED ORDER — SODIUM CHLORIDE 0.9 % IV SOLN
INTRAVENOUS | Status: DC
Start: 2021-03-26 — End: 2021-03-28

## 2021-03-26 MED ORDER — METOPROLOL TARTRATE 50 MG PO TABS: 50.0000 mg | ORAL_TABLET | Freq: Once | ORAL | Status: DC

## 2021-03-26 MED ORDER — METHOCARBAMOL 500 MG PO TABS
500.0000 mg | ORAL_TABLET | Freq: Three times a day (TID) | ORAL | Status: DC
  Administered 2021-03-26 – 2021-03-28 (×6): 500 mg via ORAL
  Filled 2021-03-26 (×6): qty 1

## 2021-03-26 NOTE — Consult Note (Signed)
Cardiology Consultation:   Patient ID: Kristin Elliott MRN: [id number]; DOB: 1976-06-05  Admit date: 03/26/2021 Date of Consult: 03/26/2021  PCP:  Default, Provider, MD   [facility] Providers Cardiologist: New to [place]  Patient Profile:   Kristin Elliott is a 45 y.o. female with a hx of bradycardia s/p Medtronic pacemaker, bipolar disorder and prior multiple visits for chest pain based on review who is being seen 03/26/2021 for the evaluation of chest pain and abnormal EKG at the request of Dr. Lucianne Muss.  Patient has history of Medtronic pacemaker implantation for bradycardia in 2020 in [place].  Per Care Everywhere she had a cardiac cath around that time but unable to find records.  Patient was seen by cardiologist March 2022 at [place], [place] for recurrent chest pain.  Plan was to get PET stress test and echocardiogram.  No result available in Care Everywhere.  History of Present Illness:   [name] moving to [place] from [place] about 2 months ago.  Prior history of orthostatic hypotension with recurrent syncope based on July 2022 admission.  Echocardiogram showed normal LV function without wall motion abnormality.  Patient notes that she has had chest pain since losing her husband from Pancreatic Cancer.  Notes that she has been planned for testing in the past (PET MPI, LHC) but notes that it was not done (she is unsure why). Notes that is [place] she had symptomatic heart rates in the 30s and was given PPM for this reason.  Patient presented to emergency room with multiple complaints.  He has 1 month history of headache, left-sided numbness and fecal incontinence.  He has upcoming visit with neurologist.  Recent negative head CT.  This morning she had left-sided chest pain with nausea and vomiting.  Described sharp pain.  Came to ER for further evaluation.  Patient reports that she never got prior cardiac studies.  High-sensitivity troponin negative.  Creatinine  1.03.  Hemoglobin 11.3.  Chest x-ray without acute cardiopulmonary disease.   Patient notes that she is feeling generally unwell.  Has sternal chest pain presently.  Discomfort occurs with without trigger, worsens with spontaneously, and improves in the past with nitroglycerin.   No radiation. No DOE.  No PND or orthopnea.No syncope or near syncope . Notes  no palpitations or funny heart beats.  Normal echo in the past.    Past Medical History:  Diagnosis Date   GERD without esophagitis 02/19/2021   Presence of cardiac pacemaker 02/19/2021   Schizoaffective disorder, bipolar type (HCC) 02/19/2021    Past Surgical History:  Procedure Laterality Date   ABDOMINAL HYSTERECTOMY     ABDOMINAL SURGERY     HERNIA REPAIR       Inpatient Medications: Scheduled Meds:  aspirin EC  81 mg Oral Daily   docusate sodium  100 mg Oral BID   enoxaparin (LOVENOX) injection  40 mg Subcutaneous Q24H   methocarbamol  500 mg Oral TID   midodrine  5 mg Oral BID WC   Continuous Infusions:  sodium chloride     PRN Meds: acetaminophen **OR** acetaminophen, albuterol, LORazepam, ondansetron **OR** ondansetron (ZOFRAN) IV  Allergies:    Allergies  Allergen Reactions   Doxycycline Swelling   Lasix [Furosemide] Swelling    Reports throat swelling    Morphine And Related Hives   Reglan [Metoclopramide] Hives   Penicillins Rash   Sulfa Antibiotics Rash   Toradol [Ketorolac Tromethamine] Rash   Tramadol Rash    Social History:   Social  History   Socioeconomic History   Marital status: Significant Other    Spouse name: Not on file   Number of children: Not on file   Years of education: Not on file   Highest education level: Not on file  Occupational History   Not on file  Tobacco Use   Smoking status: Never   Smokeless tobacco: Never  Substance and Sexual Activity   Alcohol use: Not Currently   Drug use: Never   Sexual activity: Not on file  Other Topics Concern   Not on file  Social  History Narrative   Not on file   Social Determinants of Health   Financial Resource Strain: Not on file  Food Insecurity: Not on file  Transportation Needs: Not on file  Physical Activity: Not on file  Stress: Not on file  Social Connections: Not on file  Intimate Partner Violence: Not on file    Family History:   Grandmother had MI Daughter had MI related to substances and emergent intuation  ROS:  Please see the history of present illness.  All other ROS reviewed and negative.     Physical Exam/Data:   Vitals:   03/26/21 1235 03/26/21 1451 03/26/21 1600 03/26/21 1700  BP: 109/71 114/71 108/65 100/68  Pulse: 60 60 60 (!) 55  Resp: 18 18 16 12   Temp:      TempSrc:      SpO2: 100% 100% 100% 91%  Weight:      Height:       No intake or output data in the 24 hours ending 03/26/21 1804 Last 3 Weights 03/26/2021 03/24/2021 03/02/2021  Weight (lbs) 240 lb 240 lb 241 lb  Weight (kg) 108.863 kg 108.863 kg 109.317 kg     Body mass index is 35.44 kg/m.  General:  Obese female well developed, in mild distress HEENT: normal Lymph: no adenopathy Neck: no JVD Endocrine:  No thryomegaly Vascular: No carotid bruits; FA pulses 2+ bilaterally without bruits  Cardiac:  normal S1, S2; RRR; no murmur  Lungs:  clear to auscultation bilaterally, no wheezing, rhonchi or rales  Abd: soft, nontender, no hepatomegaly  Ext: no edema Musculoskeletal:  No deformities, BUE and BLE strength normal and equal Skin: warm and dry, small bite marks on both legs bilaterally Neuro:  CNs 2-12 intact, no focal abnormalities noted Psych:  Normal affect   EKG:  The EKG was personally reviewed and demonstrates: Sinus rhythm with inferior lateral T wave inversion (no A pacing) Telemetry:  Telemetry was personally reviewed and demonstrates:  N/A  Relevant CV Studies:  Reviewed echo from 02/21/21: LVEF 60 to 65%, no wall motion abnormality, LV diastolic parameters are normal.  Normal RV function.  No  significant valvular abnormality.  Laboratory Data:  High Sensitivity Troponin:   Recent Labs  Lab 03/26/21 1029 03/26/21 1350  TROPONINIHS 3 3     Chemistry Recent Labs  Lab 03/24/21 1347 03/26/21 1029  NA 140 141  K 3.4* 3.7  CL 110 109  CO2 23 25  GLUCOSE 115* 97  BUN 12 15  CREATININE 1.20* 1.03*  CALCIUM 8.7* 8.9  GFRNONAA 56* >60  ANIONGAP 7 7    No results for input(s): PROT, ALBUMIN, AST, ALT, ALKPHOS, BILITOT in the last 168 hours. Hematology Recent Labs  Lab 03/24/21 1347 03/26/21 1029  WBC 5.5 4.0  RBC 3.72* 3.77*  HGB 11.3* 11.3*  HCT 34.4* 34.8*  MCV 92.5 92.3  MCH 30.4 30.0  MCHC 32.8 32.5  RDW 13.2 13.3  PLT 212 205    Radiology/Studies:  DG Chest 2 View  Result Date: 03/26/2021 CLINICAL DATA:  Chest pain. Additional provided: Patient reports headache for 1 month, chest pain, nausea/vomiting. EXAM: CHEST - 2 VIEW COMPARISON:  Prior chest radiographs 02/19/2021. FINDINGS: Redemonstrated left chest dual lead implantable cardiac device. Heart size within normal limits. No appreciable airspace consolidation. No evidence of pleural effusion or pneumothorax. No acute bony abnormality identified. IMPRESSION: No evidence of acute cardiopulmonary abnormality. Electronically Signed   By: Jackey Loge DO   On: 03/26/2021 11:02   CT HEAD WO CONTRAST ( )  Result Date: 03/24/2021 CLINICAL DATA:  Headache, chronic, new features or increased frequency EXAM: CT HEAD WITHOUT CONTRAST TECHNIQUE: Contiguous axial images were obtained from the base of the skull through the vertex without intravenous contrast. COMPARISON:  None. FINDINGS: Brain: No evidence of acute large vascular territory infarction, hemorrhage, hydrocephalus, extra-axial collection or mass lesion/mass effect. Vascular: No hyperdense vessel identified. Skull: No acute fracture. Sinuses/Orbits: Visualized sinuses are clear.  Unremarkable orbits. Other: No mastoid effusions. IMPRESSION: No evidence of  acute intracranial abnormality. Electronically Signed   By: Feliberto Harts MD   On: 03/24/2021 15:16     Assessment and Plan:   Chest pain -Troponin negative.  EKG with inferior lateral T wave inversion.  Patient has prior history of chest pain and was scheduled for test but never completed as summarized above. -We will plan for coronary CTA in the morning.  Will give metoprolol 50 mg x 1 unless heart rate falls further.  Will give Nitro prior.  2. Bradycardia - has Medtronic PPM.  In AM will work to get it interrogated; we will have her follow up with our device clinicl  Otherwise per primary team.   Risk Assessment/Risk Scores:   HEAR Score (for undifferentiated chest pain):  HEAR Score: 3{  For questions or updates, please contact [facility] Please consult [email or web address] for contact info under    Riley Lam, MD Cardiologist [facility]  [address] [place], [address] [place], [place] [address] [phone]  8:26 PM

## 2021-03-26 NOTE — ED Triage Notes (Addendum)
Pt here via EMS with c/o of headache X1 month. Chest pain started this am. Pt endorses N/V X1 day.  Alert and oriented X4  124/91 HR 64 CBG 109 96.9
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
## 2021-03-26 NOTE — ED Provider Notes (Signed)
Emergency Medicine Provider Triage Evaluation Note  Kristin Elliott , a 45 y.o. female  was evaluated in triage.  Pt complains of gradual onset, constant, waxing and waning, sharp, substernal chest pain that began this morning around 8 AM. Also complains of N/V, and diaphoresis. No fhx of CAD < 42 years of age. Pt is a former occasional smoker. She also complains of a headache x 1 month with EMS; seen 2 days ago for same with negative CT head. States she plans to see a neurologist tomorrow however mentioned to neurology that she was having chest pain and they advised she come to the ED for further eval. She does report hx of PE s/2 breast reduction several  years ago, on Coumadin for a couple of months. States this feels different. No recent prolonged travel or immobilization.   Review of Systems  Positive: + chest pain, N/V, diaphoresis Negative: - SOB  Physical Exam  BP 99/69   Pulse 63   Temp 98.5 F (36.9 C) (Oral)   Resp 16   Ht 5\' 9"  (1.753 m)   Wt 108.9 kg   SpO2 99%   BMI 35.44 kg/m  Gen:   Awake, no distress   Resp:  Normal effort  MSK:   Moves extremities without difficulty  Other:  Equal pulses bilaterally. Nondiaphoretic.   Medical Decision Making  Medically screening exam initiated at 9:52 AM.  Appropriate orders placed.  [name] was informed that the remainder of the evaluation will be completed by another provider, this initial triage assessment does not replace that evaluation, and the importance of remaining in the ED until their evaluation is complete.  ACS workup. Hx of PE in the past s/2 breast reduction however nontachycardic now; no provocation recently. Also complains of headache - seen 2 days ago for same with plans to see neuro tomorrow.    [name], PA-C 03/26/21 05/26/21    0947, MD 03/26/21 1946

## 2021-03-26 NOTE — H&P (Addendum)
History and Physical    Kristin Elliott NOM:[id number] DOB: 10-14-1975 DOA: 03/26/2021  PCP: Default, Provider, MD   Patient coming from:  Home  I have personally briefly reviewed patient's old medical records in [facility]  Chief Complaint:   Chest pain, Chronic headache, Nausea and vomiting, Left sided numbness.  HPI: Kristin Elliott is a 45 y.o. female with medical history significant of schizoaffective disorder, s/p pacemaker, GERD who has recently moved from [place] 2 months ago presented in the ED with multiple complaints.  Patient reports having chronic headache associated with left-sided numbness and intermittent urinary and fecal incontinence for 1 month.  She describes her symptoms as numbness on the left side of face,  left arm and left leg and describes it as dull sensation. She has not seen neurologist but takes over-the-counter pain medication with partial improvement in headache.  Patient was seen in the ED 2 days ago and was discharged with follow-up with neurology after CT head was negative.  She is scheduled to have appointment with neurologist tomorrow.  She has developed intermittent left sided substernal chest pain that began this morning around 8 AM associated with nausea, vomiting and diaphoresis.  Patient describes chest pain as sharp radiating towards the neck and left shoulder.  She denies any family history of heart disease.  She does report history of PE secondary to breast reduction several years ago and was on Coumadin for few months.  She denies any recent travel or prolonged immobilization.  She also reported having persistent nausea and vomiting and has thrown up several times since morning.  She is unable to tolerate p.o. and thinks this is related to the headache.  She denies any fever, chills, sick contacts, recent travel, cough, weight loss.  She is up-to-date with COVID-vaccine including booster.  ED Course: She was hemodynamically stable.   Vitals HR 60, RR 16, BP 108/68, temp 98.5, SPO2 100% Labs includes: Sodium 141, potassium 3.7, chloride 109, bicarb 25, glucose 97, BUN 15, creatinine 1.03, calcium 8.9, anion gap 7, troponin 3> 3, WBC 4.0 hemoglobin 11.3 hematocrit 34.8, MCV 92.3, platelet 205, Chest x-ray no acute abnormalities.  Patient had a CT head 2 days ago no evidence of acute intracranial abnormality.  Review of Systems: Review of Systems  Constitutional: Negative.   HENT: Negative.    Eyes: Negative.   Respiratory: Negative.    Cardiovascular:  Positive for chest pain.  Gastrointestinal:  Positive for nausea and vomiting.  Genitourinary: Negative.   Musculoskeletal:  Positive for neck pain.  Neurological:  Positive for sensory change and headaches.  Endo/Heme/Allergies: Negative.   Psychiatric/Behavioral:  Positive for depression.    Past Medical History:  Diagnosis Date   GERD without esophagitis 02/19/2021   Presence of cardiac pacemaker 02/19/2021   Schizoaffective disorder, bipolar type (HCC) 02/19/2021    Past Surgical History:  Procedure Laterality Date   ABDOMINAL HYSTERECTOMY     ABDOMINAL SURGERY     HERNIA REPAIR       reports that she has never smoked. She has never used smokeless tobacco. She reports previous alcohol use. She reports that she does not use drugs.  Allergies  Allergen Reactions   Doxycycline Swelling   Lasix [Furosemide] Swelling    Reports throat swelling    Morphine And Related Hives   Reglan [Metoclopramide] Hives   Penicillins Rash   Sulfa Antibiotics Rash   Toradol [Ketorolac Tromethamine] Rash   Tramadol Rash    No family history  on file.  Family history reviewed and not pertinent.  Prior to Admission medications   Medication Sig Start Date End Date Taking? Authorizing Provider  albuterol (VENTOLIN HFA) 108 (90 Base) MCG/ACT inhaler Inhale 2 puffs into the lungs every 6 (six) hours as needed for wheezing or shortness of breath. 12/19/20  Yes [provider]  ARIPiprazole ER (ABILIFY MAINTENA) 400 MG SRER injection Inject 400 mg into the muscle every 28 (twenty-eight) days.   Yes [provider]  LORazepam (ATIVAN) 2 MG tablet Take 2 mg by mouth 2 (two) times daily as needed for anxiety.   Yes [provider]  methocarbamol (ROBAXIN) 500 MG tablet Take 1 tablet (500 mg total) by mouth 3 (three) times daily. 02/27/21  Yes [name], MD  midodrine (PROAMATINE) 5 MG tablet Take 1 tablet (5 mg total) by mouth 2 (two) times daily with a meal. 02/27/21  Yes [name], MD  dicyclomine (BENTYL) 20 MG tablet Take 1 tablet (20 mg total) by mouth 2 (two) times daily. Patient not taking: No sig reported 03/01/21   [name], MD  famotidine (PEPCID) 20 MG tablet Take 20 mg by mouth 2 (two) times daily. 12/15/20   [provider]  LORazepam (ATIVAN) 0.5 MG tablet Take 1 tablet (0.5 mg total) by mouth daily as needed for anxiety. Patient not taking: No sig reported 02/26/21   [name], MD  ondansetron (ZOFRAN ODT) 4 MG disintegrating tablet Take 1 tablet (4 mg total) by mouth every 8 (eight) hours as needed for nausea or vomiting. Patient not taking: No sig reported 03/01/21   [name], MD  polyethylene glycol (MIRALAX / GLYCOLAX) 17 g packet Take 17 g by mouth daily as needed for mild constipation. Patient not taking: No sig reported 02/26/21   [name], MD    Physical Exam: Vitals:   03/26/21 1235 03/26/21 1451 03/26/21 1600 03/26/21 1700  BP: 109/71 114/71 108/65 100/68  Pulse: 60 60 60 (!) 55  Resp: 18 18 16 12   Temp:      TempSrc:      SpO2: 100% 100% 100% 91%  Weight:      Height:        Constitutional: NAD, calm, comfortable Vitals:   03/26/21 1235 03/26/21 1451 03/26/21 1600 03/26/21 1700  BP: 109/71 114/71 108/65 100/68  Pulse: 60 60 60 (!) 55  Resp: 18 18 16 12   Temp:      TempSrc:      SpO2: 100% 100% 100% 91%  Weight:      Height:       Eyes: PERRL, lids and  conjunctivae normal ENMT: Mucous membranes are moist. Posterior pharynx clear of any exudate or lesions.Normal dentition.  Neck: normal, supple, no masses, no thyromegaly Respiratory: clear to auscultation bilaterally, no wheezing, no crackles. Normal respiratory effort. No accessory muscle use.  Chest: Left-sided chest tenderness, reproducible.  Pacemaker noted in the left upper chest Cardiovascular: Regular rate and rhythm, no murmurs / rubs / gallops. No extremity edema. 2+ pedal pulses. No carotid bruits.  Abdomen: no tenderness, no masses palpated. No hepatosplenomegaly. Bowel sounds positive.  Musculoskeletal: no clubbing / cyanosis. No joint deformity upper and lower extremities. Good ROM, no contractures. Normal muscle tone.  Skin: no rashes, lesions, ulcers. No induration Neurologic: CN 2-12 grossly intact. Sensation decreased , strength 5/5 on left side, 5/5 on right side. Psychiatric: Normal judgment and insight. Alert and oriented x 3. Normal mood.     Labs on  Admission: I have personally reviewed following labs and imaging studies  CBC: Recent Labs  Lab 03/24/21 1347 03/26/21 1029  WBC 5.5 4.0  NEUTROABS  --  2.4  HGB 11.3* 11.3*  HCT 34.4* 34.8*  MCV 92.5 92.3  PLT 212 205   Basic Metabolic Panel: Recent Labs  Lab 03/24/21 1347 03/26/21 1029  NA 140 141  K 3.4* 3.7  CL 110 109  CO2 23 25  GLUCOSE 115* 97  BUN 12 15  CREATININE 1.20* 1.03*  CALCIUM 8.7* 8.9   GFR: Estimated Creatinine Clearance: 88.8 mL/min (A) (by C-G formula based on SCr of 1.03 mg/dL (H)). Liver Function Tests: No results for input(s): AST, ALT, ALKPHOS, BILITOT, PROT, ALBUMIN in the last 168 hours. No results for input(s): LIPASE, AMYLASE in the last 168 hours. No results for input(s): AMMONIA in the last 168 hours. Coagulation Profile: No results for input(s): INR, PROTIME in the last 168 hours. Cardiac Enzymes: No results for input(s): CKTOTAL, CKMB, CKMBINDEX, TROPONINI in the  last 168 hours. BNP (last 3 results) No results for input(s): PROBNP in the last 8760 hours. HbA1C: No results for input(s): HGBA1C in the last 72 hours. CBG: No results for input(s): GLUCAP in the last 168 hours. Lipid Profile: No results for input(s): CHOL, HDL, LDLCALC, TRIG, CHOLHDL, LDLDIRECT in the last 72 hours. Thyroid Function Tests: No results for input(s): TSH, T4TOTAL, FREET4, T3FREE, THYROIDAB in the last 72 hours. Anemia Panel: No results for input(s): VITAMINB12, FOLATE, FERRITIN, TIBC, IRON, RETICCTPCT in the last 72 hours. Urine analysis:    Component Value Date/Time   COLORURINE STRAW (A) 02/21/2021 0235   APPEARANCEUR CLEAR 02/21/2021 0235   LABSPEC 1.012 02/21/2021 0235   PHURINE 5.0 02/21/2021 0235   GLUCOSEU NEGATIVE 02/21/2021 0235   HGBUR NEGATIVE 02/21/2021 0235   BILIRUBINUR NEGATIVE 02/21/2021 0235   KETONESUR NEGATIVE 02/21/2021 0235   PROTEINUR NEGATIVE 02/21/2021 0235   NITRITE NEGATIVE 02/21/2021 0235   LEUKOCYTESUR SMALL (A) 02/21/2021 0235    Radiological Exams on Admission: DG Chest 2 View  Result Date: 03/26/2021 CLINICAL DATA:  Chest pain. Additional provided: Patient reports headache for 1 month, chest pain, nausea/vomiting. EXAM: CHEST - 2 VIEW COMPARISON:  Prior chest radiographs 02/19/2021. FINDINGS: Redemonstrated left chest dual lead implantable cardiac device. Heart size within normal limits. No appreciable airspace consolidation. No evidence of pleural effusion or pneumothorax. No acute bony abnormality identified. IMPRESSION: No evidence of acute cardiopulmonary abnormality. Electronically Signed   By: Jackey LogeKyle  Golden DO   On: 03/26/2021 11:02    EKG: Independently reviewed. NSR, T wave inversion II, III  Assessment/Plan Principal Problem:   Chest pain Active Problems:   GERD without esophagitis   Presence of cardiac pacemaker   Schizoaffective disorder, bipolar type (HCC)   Chronic headache   Chest pain:  Patient presented with  chest pain with typical and atypical features. She describes sharp left-sided chest pain radiating towards her neck and left arm,  Troponin x 2 was negative. continue to trend troponin EKG showed T wave inversion in II and III, new from prior EKG Admit to telemetry, obtain echocardiogram Patient needs outpatient cardiologist. Cardio consulted. Obtain TSH, lipid profile, hemoglobin A1c Start aspirin, Lipitor.  Chronic headache with intermittent numbness. Patient presented with chronic headache, with intermittent numbness on the left side. She also reported intermittent urinary and fecal incontinence. She reported history of migraine in the past, differentials include multiple sclerosis, complex migraine. CT head: No acute intracranial abnormality. She does have subjective  weakness on the left side.reports left facial numbness   She is scheduled to have outpatient appointment with neurologist tomorrow. Obtain MRI, will be completed tomorrow once pacemaker compatibility verified. Neurology consulted awaiting recommendation.  Nausea vomiting: Continue IV fluids with gentle hydration IV Zofran as needed  S/p pacemaker: Stable, needs cardiology follow-up.  Hypotension :  continue midodrine.  Anxiety /depression: Continue home medications  Schizoaffective disorder : continue home medications   DVT prophylaxis: Lovenox Code Status: Full code. Family Communication: No family at bed side. Disposition Plan:   Status is: Observation  The patient remains OBS appropriate and will d/c before 2 midnights.  Dispo: The patient is from: Home              Anticipated d/c is to: Home              Patient currently is not medically stable to d/c.   Difficult to place patient No  Consults called: Cardiology, Neurology Admission status: Observation   Cipriano Bunker MD [facility]   If 7PM-7AM, please contact night-coverage. 03/26/2021, 5:52 PM

## 2021-03-27 ENCOUNTER — Observation Stay (HOSPITAL_COMMUNITY): Payer: Medicare (Managed Care)

## 2021-03-27 ENCOUNTER — Encounter (HOSPITAL_COMMUNITY): Payer: Self-pay | Admitting: Family Medicine

## 2021-03-27 ENCOUNTER — Ambulatory Visit: Payer: Medicare (Managed Care) | Admitting: Neurology

## 2021-03-27 DIAGNOSIS — R072 Precordial pain: Secondary | ICD-10-CM | POA: Diagnosis not present

## 2021-03-27 DIAGNOSIS — R079 Chest pain, unspecified: Secondary | ICD-10-CM

## 2021-03-27 DIAGNOSIS — R0789 Other chest pain: Secondary | ICD-10-CM | POA: Diagnosis not present

## 2021-03-27 DIAGNOSIS — R519 Headache, unspecified: Secondary | ICD-10-CM

## 2021-03-27 DIAGNOSIS — G8929 Other chronic pain: Secondary | ICD-10-CM | POA: Diagnosis not present

## 2021-03-27 DIAGNOSIS — F250 Schizoaffective disorder, bipolar type: Secondary | ICD-10-CM

## 2021-03-27 DIAGNOSIS — Z20822 Contact with and (suspected) exposure to covid-19: Secondary | ICD-10-CM | POA: Diagnosis not present

## 2021-03-27 DIAGNOSIS — R9431 Abnormal electrocardiogram [ECG] [EKG]: Secondary | ICD-10-CM | POA: Diagnosis not present

## 2021-03-27 LAB — COMPREHENSIVE METABOLIC PANEL
ALT: 16 U/L (ref 0–44)
AST: 17 U/L (ref 15–41)
Albumin: 3.4 g/dL — ABNORMAL LOW (ref 3.5–5.0)
Alkaline Phosphatase: 79 U/L (ref 38–126)
Anion gap: 6 (ref 5–15)
BUN: 14 mg/dL (ref 6–20)
CO2: 25 mmol/L (ref 22–32)
Calcium: 8.9 mg/dL (ref 8.9–10.3)
Chloride: 110 mmol/L (ref 98–111)
Creatinine, Ser: 1.06 mg/dL — ABNORMAL HIGH (ref 0.44–1.00)
GFR, Estimated: >60 mL/min (ref >60)
Glucose, Bld: 100 mg/dL — ABNORMAL HIGH (ref 70–99)
Potassium: 3.3 mmol/L — ABNORMAL LOW (ref 3.5–5.1)
Sodium: 141 mmol/L (ref 135–145)
Total Bilirubin: 0.4 mg/dL (ref 0.3–1.2)
Total Protein: 6.7 g/dL (ref 6.5–8.1)

## 2021-03-27 LAB — CBC
HCT: 32.3 % — ABNORMAL LOW (ref 36.0–46.0)
Hemoglobin: 11.1 g/dL — ABNORMAL LOW (ref 12.0–15.0)
MCH: 31.2 pg (ref 26.0–34.0)
MCHC: 34.4 g/dL (ref 30.0–36.0)
MCV: 90.7 fL (ref 80.0–100.0)
Platelets: 184 10*3/uL (ref 150–400)
RBC: 3.56 MIL/uL — ABNORMAL LOW (ref 3.87–5.11)
RDW: 13.4 % (ref 11.5–15.5)
WBC: 4 10*3/uL (ref 4.0–10.5)
nRBC: 0 % (ref 0.0–0.2)

## 2021-03-27 LAB — LIPID PANEL
Cholesterol: 187 mg/dL (ref 0–200)
HDL: 43 mg/dL (ref >40)
LDL Cholesterol: 110 mg/dL — ABNORMAL HIGH (ref 0–99)
Total CHOL/HDL Ratio: 4.3 RATIO
Triglycerides: 170 mg/dL — ABNORMAL HIGH (ref <150)
VLDL: 34 mg/dL (ref 0–40)

## 2021-03-27 LAB — HEMOGLOBIN A1C
Hgb A1c MFr Bld: 5.7 % — ABNORMAL HIGH (ref 4.8–5.6)
Mean Plasma Glucose: 116.89 mg/dL

## 2021-03-27 LAB — FOLATE: Folate: 10.1 ng/mL (ref >5.9)

## 2021-03-27 LAB — MAGNESIUM: Magnesium: 2.1 mg/dL (ref 1.7–2.4)

## 2021-03-27 LAB — VITAMIN D 25 HYDROXY (VIT D DEFICIENCY, FRACTURES): Vit D, 25-Hydroxy: 41.23 ng/mL (ref 30–100)

## 2021-03-27 LAB — TSH: TSH: 37.711 u[IU]/mL — ABNORMAL HIGH (ref 0.350–4.500)

## 2021-03-27 MED ORDER — ONDANSETRON HCL 4 MG/2ML IJ SOLN
4.0000 mg | Freq: Once | INTRAMUSCULAR | Status: AC
  Administered 2021-03-27: 4 mg via INTRAVENOUS
  Filled 2021-03-27: qty 2

## 2021-03-27 MED ORDER — OXYCODONE HCL 5 MG PO TABS
5.0000 mg | ORAL_TABLET | Freq: Three times a day (TID) | ORAL | Status: DC | PRN
  Administered 2021-03-27 – 2021-03-28 (×3): 5 mg via ORAL
  Filled 2021-03-27 (×3): qty 1

## 2021-03-27 MED ORDER — NITROGLYCERIN 0.4 MG SL SUBL
SUBLINGUAL_TABLET | SUBLINGUAL | Status: AC
  Filled 2021-03-27: qty 2

## 2021-03-27 MED ORDER — GADOBUTROL 1 MMOL/ML IV SOLN
10.0000 mL | Freq: Once | INTRAVENOUS | Status: AC | PRN
  Administered 2021-03-27: 10 mL via INTRAVENOUS

## 2021-03-27 MED ORDER — LEVOTHYROXINE SODIUM 50 MCG PO TABS
50.0000 ug | ORAL_TABLET | Freq: Every day | ORAL | Status: DC
  Administered 2021-03-28: 50 ug via ORAL
  Filled 2021-03-27: qty 1

## 2021-03-27 MED ORDER — POTASSIUM CHLORIDE 20 MEQ PO PACK
40.0000 meq | PACK | Freq: Once | ORAL | Status: AC
  Administered 2021-03-27: 40 meq via ORAL
  Filled 2021-03-27: qty 2

## 2021-03-27 MED ORDER — CYANOCOBALAMIN 1000 MCG/ML IJ SOLN
1000.0000 ug | Freq: Every day | INTRAMUSCULAR | Status: DC
  Administered 2021-03-27 – 2021-03-28 (×2): 1000 ug via INTRAMUSCULAR
  Filled 2021-03-27 (×2): qty 1

## 2021-03-27 MED ORDER — VITAMIN B-12 1000 MCG PO TABS: 1000.0000 ug | ORAL_TABLET | Freq: Every day | ORAL | Status: DC

## 2021-03-27 MED ORDER — IOHEXOL 350 MG/ML SOLN
95.0000 mL | Freq: Once | INTRAVENOUS | Status: AC | PRN
  Administered 2021-03-27: 95 mL via INTRAVENOUS

## 2021-03-27 MED ORDER — POTASSIUM CHLORIDE CRYS ER 20 MEQ PO TBCR
40.0000 meq | EXTENDED_RELEASE_TABLET | Freq: Once | ORAL | Status: AC
  Filled 2021-03-27: qty 2
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
## 2021-03-27 NOTE — Progress Notes (Signed)
  Evaluation after Contrast Extravasation  Patient seen and examined immediately after contrast extravasation while in CT scanner 1.  Exam: There is swelling at the inferior to right AC area.  There is no erythema. There is no discoloration. There are no blisters. There are no signs of decreased perfusion of the skin.  It is slight cold to touch. Ice pack noted to be at location upon assessment The patient has full ROM in fingers.  Radial pulse is normal.  Per contrast extravasation protocol, I have instructed the patient to keep an ice pack on the area for 20-60 minutes at a time for about 48 hours.   Keep arm elevated as much as possible.   The patient understands to call the radiology department if there is: - increase in pain or swelling - changed or altered sensation - ulceration or blistering - increasing redness - warmth or increasing firmness - decreased tissue perfusion as noted by decreased capillary refill or discoloration of skin - decreased pulses peripheral to site   Alene Mires PA-C 03/27/2021 12:52 PM
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
## 2021-03-27 NOTE — Progress Notes (Signed)
Progress Note  Patient Name: Kristin Elliott Date of Encounter: 03/27/2021  Primary Cardiologist: New to Izora Ribas MD  Subjective   No events overnight.  Device is pacing appropriately.  Patient notes persistent tingling in her legs bilaterally.  CP has improved.  Inpatient Medications    Scheduled Meds:  aspirin EC  81 mg Oral Daily   cyanocobalamin  1,000 mcg Intramuscular Daily   Followed by   Melene Muller ON 04/03/2021] vitamin B-12  1,000 mcg Oral Daily   docusate sodium  100 mg Oral BID   enoxaparin (LOVENOX) injection  40 mg Subcutaneous Q24H   [START ON 03/28/2021] levothyroxine  50 mcg Oral Q0600   methocarbamol  500 mg Oral TID   metoprolol tartrate  50 mg Oral Once   midodrine  5 mg Oral BID WC   Continuous Infusions:  sodium chloride     PRN Meds: acetaminophen **OR** acetaminophen, albuterol, fentaNYL (SUBLIMAZE) injection, LORazepam, ondansetron **OR** ondansetron (ZOFRAN) IV, oxyCODONE   Vital Signs    Vitals:   03/26/21 2302 03/26/21 2303 03/27/21 0331 03/27/21 0720  BP:  111/82 102/71 114/73  Pulse:  61 (!) 59 (!) 59  Resp:  18 20 17   Temp:  98.2 F (36.8 C) 97.9 F (36.6 C) 98.3 F (36.8 C)  TempSrc:  Oral Oral Oral  SpO2:  99% 100% 99%  Weight: 113.3 kg  113.4 kg   Height: 5\' 9"  (1.753 m)       Intake/Output Summary (Last 24 hours) at 03/27/2021 1005 Last data filed at 03/27/2021 0723 Gross per 24 hour  Intake 480 ml  Output 850 ml  Net -370 ml   Filed Weights   03/26/21 0943 03/26/21 2302 03/27/21 0331  Weight: 108.9 kg 113.3 kg 113.4 kg    Telemetry    SR to A pacing - Personally Reviewed  ECG    No new - Personally Reviewed  Physical Exam   GEN: No acute distress.   Neck: No JVD Cardiac: RRR, no murmurs, rubs, or gallops.  Respiratory: Clear to auscultation bilaterally. GI: Soft, nontender, non-distended  MS: No edema; No deformity. Neuro:  Nonfocal  Psych: Normal affect   Labs    Chemistry Recent Labs  Lab  03/24/21 1347 03/26/21 1029 03/27/21 0210  NA 140 141 141  K 3.4* 3.7 3.3*  CL 110 109 110  CO2 23 25 25   GLUCOSE 115* 97 100*  BUN 12 15 14   CREATININE 1.20* 1.03* 1.06*  CALCIUM 8.7* 8.9 8.9  PROT  --   --  6.7  ALBUMIN  --   --  3.4*  AST  --   --  17  ALT  --   --  16  ALKPHOS  --   --  79  BILITOT  --   --  0.4  GFRNONAA 56* >60 >60  ANIONGAP 7 7 6      Hematology Recent Labs  Lab 03/24/21 1347 03/26/21 1029 03/27/21 0210  WBC 5.5 4.0 4.0  RBC 3.72* 3.77* 3.56*  HGB 11.3* 11.3* 11.1*  HCT 34.4* 34.8* 32.3*  MCV 92.5 92.3 90.7  MCH 30.4 30.0 31.2  MCHC 32.8 32.5 34.4  RDW 13.2 13.3 13.4  PLT 212 205 184    Cardiac EnzymesNo results for input(s): TROPONINI in the last 168 hours. No results for input(s): TROPIPOC in the last 168 hours.   BNPNo results for input(s): BNP, PROBNP in the last 168 hours.   DDimer No results for input(s): DDIMER in the  last 168 hours.   Radiology    DG Chest 2 View  Result Date: 03/26/2021 CLINICAL DATA:  Chest pain. Additional provided: Patient reports headache for 1 month, chest pain, nausea/vomiting. EXAM: CHEST - 2 VIEW COMPARISON:  Prior chest radiographs 02/19/2021. FINDINGS: Redemonstrated left chest dual lead implantable cardiac device. Heart size within normal limits. No appreciable airspace consolidation. No evidence of pleural effusion or pneumothorax. No acute bony abnormality identified. IMPRESSION: No evidence of acute cardiopulmonary abnormality. Electronically Signed   By: Jackey Loge DO   On: 03/26/2021 11:02    Cardiac Studies   Transthoracic Echocardiogram: Date: 02/21/21 Results:  1. Left ventricular ejection fraction, by estimation, is 60 to 65%. The  left ventricle has normal function. The left ventricle has no regional  wall motion abnormalities. Left ventricular diastolic parameters were  normal.   2. Right ventricular systolic function is normal. The right ventricular  size is normal. Tricuspid  regurgitation signal is inadequate for assessing  PA pressure.   3. The mitral valve is normal in structure. Trivial mitral valve  regurgitation. No evidence of mitral stenosis.   4. The aortic valve is tricuspid. Aortic valve regurgitation is not  visualized. No aortic stenosis is present.   5. The inferior vena cava is normal in size with greater than 50%  respiratory variability, suggesting right atrial pressure of 3 mmHg.   Patient Profile     45 y.o. female with Morbid Obesity s/p gastric sleeve; Bradycardia and chronotropic incompetence s/p PPM  Assessment & Plan    Morbid Obesity S/p PPM with occasional A pacing CP - lack of CAC is re assuring, but given history of significant chest pain and other tests cancelled (Cath, PET MPI)  purusing CCTA will be helpful for best targeting her therapy.  If no obstructive lesions of CT, appropriate for DC and outpatient cardiology follow up (gen and EP)  For questions or updates, please contact [facility] Please consult [email or web address] for contact info under Cardiology/STEMI.      Signed, Christell Constant, MD  03/27/2021, 10:05 AM

## 2021-03-27 NOTE — Progress Notes (Signed)
   03/27/21 1954  Vitals  Temp 98.7 F (37.1 C)  Temp Source Oral  BP 109/63  MAP (mmHg) 78  BP Method Automatic  Pulse Rate 61  MEWS COLOR  MEWS Score Color Green  Pain Assessment  Pain Scale 0-10  Pain Score 7  Pain Type Acute pain  Pain Location Chest  Pain Orientation Left  Pain Radiating Towards left arm and left leg becomes numb  Pain Descriptors / Indicators Aching;Sharp  Pain Frequency Intermittent  Pain Intervention(s) MD notified (Comment);Medication (See eMAR)  MEWS Score  MEWS Temp 0  MEWS Systolic 0  MEWS Pulse 0  MEWS RR 0  MEWS LOC 0  MEWS Score 0   Patient calls out with complaints of chest pain 7/10. Sharp and aching pain on the left side that radiates down the left arm.  She states this is the same pain she has been experiencing before.  Patient also mentions numbness that occurs in the left leg after this pain. VSS and EKG completed.  MD notified of pain.  Patient given oxycodone and night time meds.

## 2021-03-27 NOTE — Progress Notes (Signed)
Per order, Changed device settings for MRI to  DOO at 75 bpm  Will program device back to pre-MRI settings after completion of exam, and send transmission. 

## 2021-03-27 NOTE — Plan of Care (Signed)

## 2021-03-27 NOTE — Plan of Care (Signed)
MRI brain, C-spine and MRA head completed. 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 MRAhead negative. 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 Recommend following up the images with neurosurgery to see if they recommend any further w/u. Plan relayed to Dr. Jarome Lamas by secure chat. Please call with questions as needed.  -- Milon Dikes, MD Neurologist [facility] Pager: [phone]

## 2021-03-28 ENCOUNTER — Other Ambulatory Visit (HOSPITAL_COMMUNITY): Payer: Self-pay

## 2021-03-28 DIAGNOSIS — Z950 Presence of cardiac pacemaker: Secondary | ICD-10-CM | POA: Diagnosis not present

## 2021-03-28 DIAGNOSIS — F250 Schizoaffective disorder, bipolar type: Secondary | ICD-10-CM | POA: Diagnosis not present

## 2021-03-28 DIAGNOSIS — K219 Gastro-esophageal reflux disease without esophagitis: Secondary | ICD-10-CM

## 2021-03-28 DIAGNOSIS — R0789 Other chest pain: Secondary | ICD-10-CM | POA: Diagnosis not present

## 2021-03-28 LAB — BASIC METABOLIC PANEL
Anion gap: 6 (ref 5–15)
BUN: 14 mg/dL (ref 6–20)
CO2: 23 mmol/L (ref 22–32)
Calcium: 8.6 mg/dL — ABNORMAL LOW (ref 8.9–10.3)
Chloride: 107 mmol/L (ref 98–111)
Creatinine, Ser: 1.09 mg/dL — ABNORMAL HIGH (ref 0.44–1.00)
GFR, Estimated: >60 mL/min (ref >60)
Glucose, Bld: 81 mg/dL (ref 70–99)
Potassium: 4 mmol/L (ref 3.5–5.1)
Sodium: 136 mmol/L (ref 135–145)

## 2021-03-28 LAB — WET PREP, GENITAL
Sperm: NONE SEEN
Trich, Wet Prep: NONE SEEN
Yeast Wet Prep HPF POC: NONE SEEN

## 2021-03-28 MED ORDER — LORAZEPAM 1 MG PO TABS
2.0000 mg | ORAL_TABLET | Freq: Two times a day (BID) | ORAL | 0 refills | Status: DC | PRN
  Filled 2021-03-28: qty 20, 10d supply, fill #0

## 2021-03-28 MED ORDER — CERTAVITE/ANTIOXIDANTS PO TABS
1.0000 | ORAL_TABLET | Freq: Every day | ORAL | 0 refills | Status: AC
  Filled 2021-03-28: qty 30, 30d supply, fill #0

## 2021-03-28 MED ORDER — CYANOCOBALAMIN 1000 MCG PO TABS
1000.0000 ug | ORAL_TABLET | Freq: Every day | ORAL | 0 refills | Status: AC
  Filled 2021-03-28: qty 30, 30d supply, fill #0

## 2021-03-28 MED ORDER — LEVOTHYROXINE SODIUM 50 MCG PO TABS
50.0000 ug | ORAL_TABLET | Freq: Every day | ORAL | 0 refills | Status: AC
  Filled 2021-03-28: qty 30, 30d supply, fill #0

## 2021-03-28 MED ORDER — ACETAMINOPHEN 325 MG PO TABS: 650.0000 mg | ORAL_TABLET | Freq: Four times a day (QID) | ORAL | Status: AC | PRN

## 2021-03-28 MED ORDER — METRONIDAZOLE 500 MG PO TABS
500.0000 mg | ORAL_TABLET | Freq: Two times a day (BID) | ORAL | 0 refills | Status: AC
  Filled 2021-03-28: qty 14, 7d supply, fill #0

## 2021-03-28 MED ORDER — ARIPIPRAZOLE ER 400 MG IM SRER
400.0000 mg | Freq: Once | INTRAMUSCULAR | Status: AC
  Administered 2021-03-28: 400 mg via INTRAMUSCULAR
  Filled 2021-03-28: qty 2

## 2021-03-28 MED ORDER — GABAPENTIN 300 MG PO CAPS
300.0000 mg | ORAL_CAPSULE | Freq: Three times a day (TID) | ORAL | 0 refills | Status: DC
  Filled 2021-03-28: qty 90, 30d supply, fill #0

## 2021-03-28 MED ORDER — GABAPENTIN 600 MG PO TABS
300.0000 mg | ORAL_TABLET | Freq: Three times a day (TID) | ORAL | Status: DC
  Administered 2021-03-28: 300 mg via ORAL
  Filled 2021-03-28: qty 1

## 2021-03-28 MED ORDER — FAMOTIDINE 20 MG PO TABS
20.0000 mg | ORAL_TABLET | Freq: Two times a day (BID) | ORAL | 0 refills | Status: AC
  Filled 2021-03-28: qty 60, 30d supply, fill #0

## 2021-03-28 MED ORDER — PROSIGHT PO TABS
1.0000 | ORAL_TABLET | Freq: Every day | ORAL | Status: DC
  Administered 2021-03-28: 1 via ORAL
  Filled 2021-03-28: qty 1

## 2021-03-28 MED ORDER — FLUCONAZOLE 150 MG PO TABS
150.0000 mg | ORAL_TABLET | Freq: Once | ORAL | Status: AC
  Administered 2021-03-28: 150 mg via ORAL
  Filled 2021-03-28: qty 1

## 2021-03-28 MED ORDER — METRONIDAZOLE 500 MG PO TABS
500.0000 mg | ORAL_TABLET | Freq: Two times a day (BID) | ORAL | Status: DC
  Administered 2021-03-28: 500 mg via ORAL
  Filled 2021-03-28: qty 1

## 2021-03-28 MED ORDER — CLOTRIMAZOLE 2 % VA CREA
1.0000 | TOPICAL_CREAM | Freq: Every day | VAGINAL | Status: DC
  Administered 2021-03-28: 1 via VAGINAL
  Filled 2021-03-28: qty 21

## 2021-03-28 NOTE — Evaluation (Signed)
Physical Therapy Evaluation Patient Details Name: Kristin Elliott MRN: [id number] DOB: 13-Aug-1976 Today's Date: 03/28/2021   History of Present Illness  45 y.o. female presents to [facility] ED on 03/26/2021 with reports of headache, L numbness, and incontinence. Head/neck/spine imaging negative with the exception of sellar asymmetry concerning for pituitary adenoma. PMH includes schizoaffective disorder, s/p pacemaker, GERD.  Clinical Impression  Pt presents to PT with deficits in sensation, power, strength, balance, endurance. Pt reports a recent history of falls due to L sided numbness/tingling. Pt also reports mild knee buckling. Pt is able to ambulate in the room independently and for limited community distances without physical assistance. Pt will benefit from continued acute PT services to improve dynamic gait and balance in an effort to reduce falls risk. PT recommends outpatient PT at the time of discharge a well as a 4 wheeled walker with seat.    Follow Up Recommendations Outpatient PT    Equipment Recommendations  Other (comment) (4 wheeled walker with seat)    Recommendations for Other Services       Precautions / Restrictions Precautions Precautions: Fall Restrictions Weight Bearing Restrictions: No      Mobility  Bed Mobility               General bed mobility comments: not assessed, pt received standing in room    Transfers Overall transfer level: Independent Equipment used: None                Ambulation/Gait Ambulation/Gait assistance: Supervision Gait Distance (Feet): 250 Feet Assistive device: 4-wheeled walker;None Gait Pattern/deviations: Step-through pattern Gait velocity: functional Gait velocity interpretation: 1.31 - 2.62 ft/sec, indicative of limited community ambulator General Gait Details: pt with slowed step-through gait, no LOB noted. Pt reports mild L knee buckling with tingling exacerbations in LLE although no buckling observed by  this PT  Stairs Stairs: Yes Stairs assistance: Supervision Stair Management: One rail Right;Step to pattern Number of Stairs: 10    Wheelchair Mobility    Modified Rankin (Stroke Patients Only)       Balance Overall balance assessment: Mild deficits observed, not formally tested                                           Pertinent Vitals/Pain Pain Assessment: No/denies pain    Home Living Family/patient expects to be discharged to:: Private residence Living Arrangements: Non-relatives/Friends Available Help at Discharge: Friend(s);Available PRN/intermittently Type of Home: Apartment Home Access: Stairs to enter Entrance Stairs-Rails: Right Entrance Stairs-Number of Steps: flight Home Layout: One level Home Equipment: None      Prior Function Level of Independence: Independent         Comments: pt does report recent falls, 3 in last month     Hand Dominance        Extremity/Trunk Assessment   Upper Extremity Assessment Upper Extremity Assessment: Overall WFL for tasks assessed    Lower Extremity Assessment Lower Extremity Assessment: LLE deficits/detail LLE Deficits / Details: grossly 4/5, LLE numbness and tingling which is exacerbated with mobility LLE Sensation: decreased light touch    Cervical / Trunk Assessment Cervical / Trunk Assessment: Normal  Communication   Communication: No difficulties  Cognition Arousal/Alertness: Awake/alert Behavior During Therapy: WFL for tasks assessed/performed Overall Cognitive Status: Within Functional Limits for tasks assessed  General Comments General comments (skin integrity, edema, etc.): VSS on RA, pt does report dizziness when ambulating although BP close to baseline, 99/64 (75).    Exercises     Assessment/Plan    PT Assessment Patient needs continued PT services  PT Problem List Decreased strength;Decreased activity  tolerance;Decreased balance;Decreased mobility;Impaired sensation       PT Treatment Interventions DME instruction;Gait training;Stair training;Functional mobility training;Therapeutic activities;Therapeutic exercise;Balance training;Patient/family education    PT Goals (Current goals can be found in the Care Plan section)  Acute Rehab PT Goals Patient Stated Goal: to reduce falls risk PT Goal Formulation: With patient Time For Goal Achievement: 04/11/21 Potential to Achieve Goals: Good Additional Goals Additional Goal #1: Pt will score >19/24 on DGI to indicate a reduced risk for falls    Frequency Min 3X/week   Barriers to discharge        Co-evaluation               AM-PAC PT "6 Clicks" Mobility  Outcome Measure Help needed turning from your back to your side while in a flat bed without using bedrails?: None Help needed moving from lying on your back to sitting on the side of a flat bed without using bedrails?: None Help needed moving to and from a bed to a chair (including a wheelchair)?: None Help needed standing up from a chair using your arms (e.g., wheelchair or bedside chair)?: None Help needed to walk in hospital room?: A Little Help needed climbing 3-5 steps with a railing? : A Little 6 Click Score: 22    End of Session   Activity Tolerance: Patient tolerated treatment well Patient left: in bed;with call bell/phone within reach Nurse Communication: Mobility status PT Visit Diagnosis: Other abnormalities of gait and mobility (R26.89);Other symptoms and signs involving the nervous system (R29.898)    Time: 4034-7425 PT Time Calculation (min) (ACUTE ONLY): 26 min   Charges:   PT Evaluation $PT Eval Low Complexity: 1 Low          Arlyss Gandy, PT, DPT Acute Rehabilitation Pager: [phone]   Arlyss Gandy 03/28/2021, 10:26 AM

## 2021-03-28 NOTE — Discharge Summary (Addendum)
Physician Discharge Summary  Kristin Elliott ZOX:[id number] DOB: [birth year] DOA: 03/26/2021  PCP: Default, Provider, MD  Admit date: 03/26/2021 Discharge date: 03/28/2021  Admitted From: Home  Disposition:  Home   Recommendations for Outpatient Follow-up and new medication changes:  Follow up with Primary Care in 7 to 10 days.  Follow up with Cardiology as scheduled Patient resumed on levothyroxine Added B12 and multivitamins. Sellar asymmetry concerning for pituitary adenoma.   Home Health: no   Equipment/Devices: na   Discharge Condition: stable  CODE STATUS: full  Diet recommendation: regular   Brief/Interim Summary: Kristin Elliott was admitted to the hospital with the working diagnosis of chest pain.    45 year old female past medical history for schizoaffective disorder, bipolar disorder, bradycardia status post pacemaker, and GERD, who presented with multiple complaints including chest pain.  Positive chronic headache, left-sided face paresthesias, left arm, left leg dull sensation.   Positive substernal left-sided chest pain that began about 4 hours prior to hospitalization, associated with nausea, vomiting and diaphoresis.  Because of persistent pain she came to the hospital.  Unable to tolerate p.o. diet.  On her initial physical examination blood pressure 109/71, heart rate 60, respiratory rate 18, oxygen saturation 100%, lungs clear to auscultation bilaterally, heart S1-S2, present, rhythmic, reproducible chest pain, abdomen soft nontender, no lower extremity edema.   Sodium 141, potassium 3.7, chloride 109, bicarb 25, glucose 97, BUN 15, creatinine 1.20, white count 4.0, hemoglobin 8.3, hematocrit 34.8, platelets 212. SARS COVID-19 negative. Head CT negative for acute changes.   Chest radiograph no infiltrates.   EKG 65 bpm, normal axis, normal intervals, sinus rhythm, no significant ST segment changes, negative T wave lead II-III, aVF, V3-V6, last 2 beats are paced  rhythm.   Patient underwent further work-up with CT coronary angiography which showed nonobstructive disease. Echocardiography with preserved LV systolic function.   Neurologic work-up with brain MRI, MRA, cervical MRI  Pituitary/ sellar asymmetry, may reflect a mildly prominent but normal gland for a female of this age with developmental asymmetry of the sellar floor or alternatively there could be underlying pituitary adenoma although 1 is not clearly demonstrated on this nondedicated study.  Cervical MRI with mild cervical spondylolysis with mild neural foraminal stenosis C3-4 and C4-5.  Normal appearance of the cervical spinal cord. Negative head MRA.  Atypical chest pain with new inferior lateral T wave inversions. Patient was admitted to the telemetry ward, she underwent further work-up with CT angiography which showed no significant obstructive disease. Echocardiography showed preserved LV systolic function.  Patient will follow-up outpatient cardiology for device monitoring.  2.  Chronic headaches and paresthesias.  Back pain patient underwent extensive neurologic work-up, she was placed on B12 and multivitamins. Imaging negative for acute changes.  Pituitary, sellar asymmetry could be related to prominent gland, in the setting of untreated hypothyroidism. Recommend follow-up as an outpatient with dedicated study.  Patient will be discharged on gabapentin.  3.  Hypothyroidism.  Patient stopped taking levothyroxine many months ago. TSH extremely elevated 37.7. Presumed levothyroxine 50 mcg daily.  4.  Dehydration, acute kidney injury, hypokalemia.  Patient received intravenous fluids and supportive medical therapy. Electrolytes were corrected.  5.  Chronic hypotension.  Continue midodrine.  6.  Schizoaffective disorder, bipolar disorder.  Continue lorazepam, follow-up as an outpatient.  7. Bacterial vaginosis. Positive clue cells, plan for 7 days of metronidazole.   8.  Obesity class 2. Calculated BMI is 37,2. Follow up as outpatient.   Discharge Diagnoses:  Principal Problem:  Chest pain Active Problems:   GERD without esophagitis   Presence of cardiac pacemaker   Schizoaffective disorder, bipolar type [facility])   Chronic headache    Discharge Instructions  Discharge Instructions     Diet - low sodium heart healthy   Complete by: As directed    Discharge instructions   Complete by: As directed    Please follow up with primary care in 7 to 10 days.   Increase activity slowly   Complete by: As directed       Allergies as of 03/28/2021       Reactions   Doxycycline Swelling   Lasix [furosemide] Swelling   Reports throat swelling   Morphine And Related Hives   Reglan [metoclopramide] Hives   Penicillins Rash   Sulfa Antibiotics Rash   Toradol [ketorolac Tromethamine] Rash   Tramadol Rash        Medication List     STOP taking these medications    dicyclomine 20 MG tablet Commonly known as: BENTYL   ondansetron 4 MG disintegrating tablet Commonly known as: Zofran ODT   polyethylene glycol 17 g packet Commonly known as: MIRALAX / GLYCOLAX       TAKE these medications    Abilify Maintena 400 MG Srer injection Generic drug: ARIPiprazole ER Inject 400 mg into the muscle every 28 (twenty-eight) days.   acetaminophen 325 MG tablet Commonly known as: TYLENOL Take 2 tablets (650 mg total) by mouth every 6 (six) hours as needed for mild pain or moderate pain.   albuterol 108 (90 Base) MCG/ACT inhaler Commonly known as: VENTOLIN HFA Inhale 2 puffs into the lungs every 6 (six) hours as needed for wheezing or shortness of breath.   CertaVite/Antioxidants Tabs Take 1 tablet by mouth daily.   cyanocobalamin 1000 MCG tablet Take 1 tablet (1,000 mcg total) by mouth daily. Start taking on: April 03, 2021   famotidine 20 MG tablet Commonly known as: PEPCID Take 1 tablet (20 mg total) by mouth 2 (two) times daily.    gabapentin 300 MG capsule Commonly known as: NEURONTIN Take 1 capsule (300 mg total) by mouth 3 (three) times daily.   levothyroxine 50 MCG tablet Commonly known as: SYNTHROID Take 1 tablet (50 mcg total) by mouth daily at 6 (six) AM. Start taking on: March 29, 2021   LORazepam 2 MG tablet Commonly known as: ATIVAN Take 2 mg by mouth 2 (two) times daily as needed for anxiety. What changed: Another medication with the same name was removed. Continue taking this medication, and follow the directions you see here.   methocarbamol 500 MG tablet Commonly known as: ROBAXIN Take 1 tablet (500 mg total) by mouth 3 (three) times daily.   metroNIDAZOLE 500 MG tablet Commonly known as: FLAGYL Take 1 tablet (500 mg total) by mouth every 12 (twelve) hours for 7 days.   midodrine 5 MG tablet Commonly known as: PROAMATINE Take 1 tablet (5 mg total) by mouth 2 (two) times daily with a meal.        Allergies  Allergen Reactions   Doxycycline Swelling   Lasix [Furosemide] Swelling    Reports throat swelling    Morphine And Related Hives   Reglan [Metoclopramide] Hives   Penicillins Rash   Sulfa Antibiotics Rash   Toradol [Ketorolac Tromethamine] Rash   Tramadol Rash    Consultations: Cardiology    Procedures/Studies: DG Chest 2 View  Result Date: 03/26/2021 CLINICAL DATA:  Chest pain. Additional provided: Patient reports headache for 1  month, chest pain, nausea/vomiting. EXAM: CHEST - 2 VIEW COMPARISON:  Prior chest radiographs 02/19/2021. FINDINGS: Redemonstrated left chest dual lead implantable cardiac device. Heart size within normal limits. No appreciable airspace consolidation. No evidence of pleural effusion or pneumothorax. No acute bony abnormality identified. IMPRESSION: No evidence of acute cardiopulmonary abnormality. Electronically Signed   By: Jackey LogeKyle  Golden DO   On: 03/26/2021 11:02   DG Abd 1 View  Result Date: 03/27/2021 CLINICAL DATA:  Piercing that was swallowed  1 month ago EXAM: ABDOMEN - 1 VIEW COMPARISON:  CT 03/01/2021 FINDINGS: Nonobstructive bowel gas pattern. There is contrast material within the renal collecting system and bladder. Degenerative disc disease at L4-L5. There are multiple surgical clips overlying the upper central abdomen and right upper quadrant. Dual chamber pacemaker leads are seen. No other metallic foreign bodies noted. IMPRESSION: Multiple surgical clips overlying the upper central abdomen and right upper quadrant. Dual chamber pacemaker leads noted. No other metallic foreign bodies identified overlying the abdomen, though note the rectum is obscured by retained contrast material within the bladder. Electronically Signed   By: Caprice RenshawJacob  Kahn   On: 03/27/2021 15:05   DG Abd 1 View  Result Date: 02/28/2021 CLINICAL DATA:  Swallowed tongue piercing. EXAM: ABDOMEN - 1 VIEW COMPARISON:  Abdominal x-ray dated February 26, 2021. FINDINGS: The tongue piercing is now in the right lower quadrant. It is unclear if it is in small or large bowel. The bowel gas pattern is normal. No radio-opaque calculi or other significant radiographic abnormality are seen. IMPRESSION: 1. The tongue piercing is now in the right lower quadrant. It is unclear if it is in small or large bowel. No obstruction. Electronically Signed   By: Obie DredgeWilliam T Derry M.D.   On: 02/28/2021 09:40   CT HEAD WO CONTRAST (5MM)  Result Date: 03/24/2021 CLINICAL DATA:  Headache, chronic, new features or increased frequency EXAM: CT HEAD WITHOUT CONTRAST TECHNIQUE: Contiguous axial images were obtained from the base of the skull through the vertex without intravenous contrast. COMPARISON:  None. FINDINGS: Brain: No evidence of acute large vascular territory infarction, hemorrhage, hydrocephalus, extra-axial collection or mass lesion/mass effect. Vascular: No hyperdense vessel identified. Skull: No acute fracture. Sinuses/Orbits: Visualized sinuses are clear.  Unremarkable orbits. Other: No mastoid  effusions. IMPRESSION: No evidence of acute intracranial abnormality. Electronically Signed   By: Feliberto HartsFrederick S Jones MD   On: 03/24/2021 15:16   MR ANGIO HEAD WO CONTRAST  Result Date: 03/27/2021 CLINICAL DATA:  Headache, new or worsening, neuro deficit (Age 34-49y). EXAM: MRA HEAD WITHOUT CONTRAST TECHNIQUE: Angiographic images of the Circle of Willis were acquired using MRA technique without intravenous contrast. COMPARISON:  No pertinent prior exam. FINDINGS: Anterior circulation: The internal carotid arteries are widely patent from skull base to carotid termini. ACAs and MCAs are patent without evidence of a proximal branch occlusion or significant proximal stenosis. No aneurysm is identified. Posterior circulation: The intracranial vertebral arteries are widely patent to the basilar with the left being dominant. The right PICA and left AICA are dominant. Patent SCAs are seen bilaterally. The basilar artery is widely patent. Posterior communicating arteries are small or absent. Both PCAs are patent without evidence of a significant proximal stenosis. No aneurysm is identified. Anatomic variants: None. IMPRESSION: Negative head MRA. Electronically Signed   By: Sebastian AcheAllen  Grady M.D.   On: 03/27/2021 17:33   MR BRAIN W WO CONTRAST  Result Date: 03/27/2021 CLINICAL DATA:  Headache, new or worsening, neuro deficit (Age 47-49y). EXAM: MRI HEAD WITHOUT  AND WITH CONTRAST TECHNIQUE: Multiplanar, multiecho pulse sequences of the brain and surrounding structures were obtained without and with intravenous contrast. CONTRAST:  49mL GADAVIST GADOBUTROL 1 MMOL/ML IV SOLN COMPARISON:  Head CT 03/24/2021 FINDINGS: Brain: There is no evidence of an acute infarct, intracranial hemorrhage, midline shift, or extra-axial fluid collection. The ventricles and sulci are normal. There is a single small focus of T2 FLAIR hyperintensity in the subcortical white matter of the lateral right frontal lobe, nonspecific and likely of no  clinical significance. On sagittal images, the pituitary gland appears asymmetrically enlarged on the left with a height of 8 mm, slightly convex superior margin, and mild depression of the left sellar floor, however the gland appears more symmetric on coronal images aside from left eccentric positioning related to the sellar floor depression. The infundibulum inserts into the central aspect of the pituitary gland (slightly left of midline) with lack of rightward displacement arguing against an underlying left-sided mass. Pituitary gland enhancement is homogeneous on this nondedicated study, and the gland also demonstrates grossly homogeneous T2 signal. The overall size of the gland is within normal limits for a female of this age. Vascular: Major intracranial vascular flow voids are preserved. Skull and upper cervical spine: Unremarkable bone marrow signal. Sinuses/Orbits: Unremarkable orbits. Mild mucosal thickening in the maxillary sinuses. No significant mastoid fluid. Other: None. IMPRESSION: 1. No acute intracranial abnormality. 2. Pituitary/sellar asymmetry as detailed above. This may reflect a mildly prominent but normal gland for a female of this age with developmental asymmetry of the sellar floor, or alternatively there could be an underlying pituitary adenoma although one is not clearly demonstrated on this nondedicated study. 3. Otherwise largely unremarkable appearance of the brain. Electronically Signed   By: Sebastian Ache M.D.   On: 03/27/2021 17:25   MR CERVICAL SPINE W WO CONTRAST  Result Date: 03/27/2021 CLINICAL DATA:  Neck pain, chronic, degenerative changes on xray. EXAM: MRI CERVICAL SPINE WITHOUT AND WITH CONTRAST TECHNIQUE: Multiplanar and multiecho pulse sequences of the cervical spine, to include the craniocervical junction and cervicothoracic junction, were obtained without and with intravenous contrast. CONTRAST:  47mL GADAVIST GADOBUTROL 1 MMOL/ML IV SOLN COMPARISON:  None. FINDINGS:  Alignment: Normal. Vertebrae: No fracture, suspicious marrow lesion, or significant marrow edema. Cord: Normal cord signal and morphology. No abnormal intradural enhancement. Posterior Fossa, vertebral arteries, paraspinal tissues: Unremarkable. Disc levels: C2-3: Negative. C3-4: Mild disc bulging and uncovertebral spurring result in mild bilateral neural foraminal stenosis without spinal stenosis. C4-5: Asymmetric right uncovertebral spurring results in mild right neural foraminal stenosis without spinal stenosis. C5-6: Small right paracentral disc protrusion without stenosis. C6-7: Negative. C7-T1: Negative. IMPRESSION: 1. Mild cervical spondylosis with mild neural foraminal stenosis at C3-4 and C4-5. 2. Small right paracentral disc protrusion at C5-6 without stenosis. 3. Normal appearance of the cervical spinal cord. Electronically Signed   By: Sebastian Ache M.D.   On: 03/27/2021 17:36   CT ABDOMEN PELVIS W CONTRAST  Result Date: 03/01/2021 CLINICAL DATA:  Worsening abdominal pain, swallowed tongue ring and had abdominal pain after BM. EXAM: CT ABDOMEN AND PELVIS WITH CONTRAST TECHNIQUE: Multidetector CT imaging of the abdomen and pelvis was performed using the standard protocol following bolus administration of intravenous contrast. CONTRAST:  28mL OMNIPAQUE IOHEXOL 350 MG/ML SOLN COMPARISON:  Hip CT February 20, 2021 and abdominal radiograph February 28, 2021 FINDINGS: Lower chest: Partially visualized intracardiac leads. Normal size heart. No significant pericardial effusion/thickening. No acute abnormality. Moderate hiatal hernia. Hepatobiliary: Hypodense 11 mm hepatic cyst on  image 19/2. No solid enhancing hepatic lesions. Gallbladder is surgically absent. No biliary ductal dilation. Pancreas: Within normal limits. Spleen: Within normal limits. Adrenals/Urinary Tract: Adrenal glands are unremarkable. Kidneys are normal, without renal calculi, solid enhancing lesion, or hydronephrosis. Bladder is unremarkable.  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 No pathologic dilation of small bowel. The appendix and terminal ileum appear normal. Small volume of formed stool throughout the colon. No evidence of acute colonic inflammation. Linear metallic density in the distal sigmoid colon on image 11/4, likely representing the reported ingested lingual ornamentation. Vascular/Lymphatic: No abdominal aortic aneurysm. No pathologically enlarged abdominal or pelvic lymph nodes. Reproductive: Status post hysterectomy. No adnexal masses. Other: No abdominopelvic ascites. No pneumoperitoneum. Prior anterior abdominal wall hernia repair. Musculoskeletal: Multilevel degenerative changes spine, please refer to MRI lumbar spine February 21, 2021 for a complete description of findings. No acute osseous abnormality. IMPRESSION: 1. No acute findings in the abdomen or pelvis. 2. Linear metallic density in the distal sigmoid colon, likely reflecting the reported ingested lingual ornamentation. No evidence of colonic perforation, obstruction or acute inflammatory change. 3. 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 Electronically Signed   By: Maudry Mayhew MD   On: 03/01/2021 19:14   CT CORONARY MORPH W/CTA COR W/SCORE W/CA W/CM &/OR WO/CM  Addendum Date: 03/27/2021   ADDENDUM REPORT: 03/27/2021 14:17 EXAM: OVER-READ INTERPRETATION  CT CHEST The following report is an over-read performed by radiologist Dr. Jeronimo Greaves of [facility] on 03/27/2021. This over-read does not include interpretation of cardiac or coronary anatomy or pathology. The coronary CTA interpretation by the cardiologist is attached. COMPARISON:  Chest radiograph 1 day prior. FINDINGS: Vascular: Aortic atherosclerosis. No central pulmonary embolism, on this non-dedicated study. Mediastinum/Nodes: No imaged thoracic adenopathy. Small hiatal  hernia with surgical sutures in the proximal stomach. Lungs/Pleura: No pleural fluid. Tiny subpleural nodules in the right middle lobe are likely subpleural lymph nodes at maximally 2 mm. 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 Normal imaged portions of the spleen. Musculoskeletal: No acute osseous abnormality. IMPRESSION: 1. No acute findings in the imaged extracardiac chest. 2. Aortic Atherosclerosis (ICD10-I70.0). 3. Small hiatal hernia. Electronically Signed   By: Jeronimo Greaves M.D.   On: 03/27/2021 14:17   Result Date: 03/27/2021 CLINICAL DATA:  89F with chest pain EXAM: Cardiac/Coronary CTA TECHNIQUE: The patient was scanned on a [employer]. FINDINGS: A 100 kV prospective scan was triggered in the descending thoracic aorta at 111 HU's. Axial non-contrast 3 mm slices were carried out through the heart. The data set was analyzed on a dedicated work station and scored using the Agatson method. Gantry rotation speed was 250 msecs and collimation was .6 mm. 0.8 mg of sl NTG was given. The 3D data set was reconstructed in 5% intervals of the 35-75 % of the R-R cycle. Phases were analyzed on a dedicated work station using MPR, MIP and VRT modes. The patient received 80 cc of contrast. Coronary Arteries:  Normal coronary origin.  Right dominance. RCA is a large dominant artery that gives rise to PDA and PLA. There is no plaque. Left main is a large artery that gives rise to LAD and LCX arteries. LAD is a large vessel that has no plaque. Mid LAD myocardial bridge measuring 91mm LCX is a non-dominant artery that gives rise to one large OM1 branch. There is no plaque. Other findings: Left Ventricle: Normal size  Left Atrium: Mild enlargement Pulmonary Veins: Normal configuration Right Ventricle: Mild dilatation Right Atrium: Mild enlargement Cardiac valves: No calcifications Thoracic aorta: Normal size Pulmonary Arteries: Dilated main PA measuring 31mm Systemic Veins: Normal drainage  Pericardium: Normal thickness IMPRESSION: 1. Coronary calcium score of 0. 2. Normal coronary origin with right dominance. 3. No evidence of CAD. 4. Mid LAD myocardial bridge measuring 18mm CAD-RADS 0. No evidence of CAD (0%). Consider non-atherosclerotic causes of chest pain. Electronically Signed: By: Epifanio Lesches MD On: 03/27/2021 13:26   DG Abd Portable 1V  Result Date: 02/26/2021 CLINICAL DATA:  Swallowed tongue piercing EXAM: PORTABLE ABDOMEN - 1 VIEW COMPARISON:  None. FINDINGS: Metal post with single spherical terminator noted projecting over the left upper quadrant. There are loops of superimposed small and large bowel in this vicinity although I would favor this as being currently in jejunum. Postoperative clips in the right upper quadrant and left upper quadrant. Formed stool in the colon. Lower lumbar degenerative disc disease. IMPRESSION: 1. The post and connected spherical terminator of the tongue piercing project over the left upper quadrant and are probably in small bowel. Electronically Signed   By: Gaylyn Rong M.D.   On: 02/26/2021 21:23   MR MRV HEAD W WO CONTRAST  Result Date: 03/27/2021 CLINICAL DATA:  Worsening headaches. EXAM: MR VENOGRAM HEAD WITHOUT AND WITH CONTRAST TECHNIQUE: Angiographic images of the intracranial venous structures were acquired using MRV technique without and with intravenous contrast. CONTRAST:  10mL GADAVIST GADOBUTROL 1 MMOL/ML IV SOLN COMPARISON:  Same day MRI. FINDINGS: No evidence of dural venous sinus thrombosis. The superior sagittal sinus, transverse sinuses, sigmoid sinuses, jugular bulbs, Street sinus, and visualized deep cerebral veins are patent. Symmetric opacification of the cavernous sinuses. IMPRESSION: No evidence of dural venous sinus thrombosis. Electronically Signed   By: Feliberto Harts MD   On: 03/27/2021 18:19      Subjective: Patient is feeling better, continue to have headache and back pain but better controlled, no  nausea or vomiting.   Discharge Exam: Vitals:   03/28/21 0754 03/28/21 1106  BP: 108/76 121/76  Pulse: 61 62  Resp: 16 17  Temp: 98 F (36.7 C) 98.3 F (36.8 C)  SpO2: 98% 100%   Vitals:   03/28/21 0100 03/28/21 0330 03/28/21 0754 03/28/21 1106  BP:  (!) 103/59 108/76 121/76  Pulse:  60 61 62  Resp:  Temp:  97.6 F (36.4 C) 98 F (36.7 C) 98.3 F (36.8 C)  TempSrc:  Oral Oral   SpO2:  98% 98% 100%  Weight: 114.3 kg     Height:        General: Not in pain or dyspnea  Neurology: Awake and alert, non focal  E ENT: no pallor, no icterus, oral mucosa moist Cardiovascular: No JVD. S1-S2 present, rhythmic, no gallops, rubs, or murmurs. No lower extremity edema. Pulmonary: positive breath sounds bilaterally, adequate air movement, no wheezing, rhonchi or rales. Gastrointestinal. Abdomen soft and non tender Skin. No rashes Musculoskeletal: no joint deformities   The results of significant diagnostics from this hospitalization (including imaging, microbiology, ancillary and laboratory) are listed below for reference.     Microbiology: Recent Results (from the past 240 hour(s))  Resp Panel by RT-PCR (Flu A&B, Covid) Nasopharyngeal Swab     Status: None   Collection Time: 03/26/21  5:53 PM   Specimen: Nasopharyngeal Swab; Nasopharyngeal(NP) swabs in vial transport medium  Result Value Ref Range Status   SARS Coronavirus 2  by RT PCR NEGATIVE NEGATIVE Final    Comment: (NOTE) SARS-CoV-2 target nucleic acids are NOT DETECTED.  The SARS-CoV-2 RNA is generally detectable in upper respiratory specimens during the acute phase of infection. The lowest concentration of SARS-CoV-2 viral copies this assay can detect is 138 copies/mL. A negative result does not preclude SARS-Cov-2 infection and should not be used as the sole basis for treatment or other patient management decisions. A negative result may occur with  improper specimen collection/handling, submission of  specimen other than nasopharyngeal swab, presence of viral mutation(s) within the areas targeted by this assay, and inadequate number of viral copies(<138 copies/mL). A negative result must be combined with clinical observations, patient history, and epidemiological information. The expected result is Negative.  Fact Sheet for Patients:  BloggerCourse.com  Fact Sheet for Healthcare Providers:  SeriousBroker.it  This test is no t yet approved or cleared by the [place] FDA and  has been authorized for detection and/or diagnosis of SARS-CoV-2 by FDA under an Emergency Use Authorization (EUA). This EUA will remain  in effect (meaning this test can be used) for the duration of the COVID-19 declaration under Section 564(b)(1) of the Act, 21 U.S.C.section 360bbb-3(b)(1), unless the authorization is terminated  or revoked sooner.       Influenza A by PCR NEGATIVE NEGATIVE Final   Influenza B by PCR NEGATIVE NEGATIVE Final    Comment: (NOTE) The Xpert Xpress SARS-CoV-2/FLU/RSV plus assay is intended as an aid in the diagnosis of influenza from Nasopharyngeal swab specimens and should not be used as a sole basis for treatment. Nasal washings and aspirates are unacceptable for Xpert Xpress SARS-CoV-2/FLU/RSV testing.  Fact Sheet for Patients: BloggerCourse.com  Fact Sheet for Healthcare Providers: SeriousBroker.it  This test is not yet approved or cleared by the [place] FDA and has been authorized for detection and/or diagnosis of SARS-CoV-2 by FDA under an Emergency Use Authorization (EUA). This EUA will remain in effect (meaning this test can be used) for the duration of the COVID-19 declaration under Section 564(b)(1) of the Act, 21 U.S.C. section 360bbb-3(b)(1), unless the authorization is terminated or revoked.  Performed at [facility], [address]  [place], [place] [address]   Wet prep, genital     Status: Abnormal   Collection Time: 03/28/21  3:30 AM  Result Value Ref Range Status   Yeast Wet Prep HPF POC NONE SEEN NONE SEEN Final   Trich, Wet Prep NONE SEEN NONE SEEN Final   Clue Cells Wet Prep HPF POC PRESENT (A) NONE SEEN Final   WBC, Wet Prep HPF POC MANY (A) NONE SEEN Final   Sperm NONE SEEN  Final    Comment: Performed at [facility], [address], [place], [place] [address]     Labs: BNP (last 3 results) No results for input(s): BNP in the last 8760 hours. Basic Metabolic Panel: Recent Labs  Lab 03/24/21 1347 03/26/21 1029 03/27/21 0210 03/27/21 0224 03/28/21 0234  NA 140 141 141  --  136  K 3.4* 3.7 3.3*  --  4.0  CL 110 109 110  --  107  CO2 --  23  GLUCOSE 115* 97 100*  --  81  BUN --  14  CREATININE 1.20* 1.03* 1.06*  --  1.09*  CALCIUM 8.7* 8.9 8.9  --  8.6*  MG  --   --   --  2.1  --  Liver Function Tests: Recent Labs  Lab 03/27/21 0210  AST 17  ALT 16  ALKPHOS 79  BILITOT 0.4  PROT 6.7  ALBUMIN 3.4*   No results for input(s): LIPASE, AMYLASE in the last 168 hours. No results for input(s): AMMONIA in the last 168 hours. CBC: Recent Labs  Lab 03/24/21 1347 03/26/21 1029 03/27/21 0210  WBC 5.5 4.0 4.0  NEUTROABS  --  2.4  --   HGB 11.3* 11.3* 11.1*  HCT 34.4* 34.8* 32.3*  MCV 92.5 92.3 90.7  PLT 212 205 184   Cardiac Enzymes: No results for input(s): CKTOTAL, CKMB, CKMBINDEX, TROPONINI in the last 168 hours. BNP: Invalid input(s): POCBNP CBG: No results for input(s): GLUCAP in the last 168 hours. D-Dimer No results for input(s): DDIMER in the last 72 hours. Hgb A1c Recent Labs    03/27/21 0210  HGBA1C 5.7*   Lipid Profile Recent Labs    03/27/21 0210  CHOL 187  HDL 43  LDLCALC 110*  TRIG 170*  CHOLHDL 4.3   Thyroid function studies Recent Labs    03/27/21 0210  TSH 37.711*   Anemia work up Recent Labs    03/27/21 0210  FOLATE 10.1    Urinalysis    Component Value Date/Time   COLORURINE STRAW (A) 02/21/2021 0235   APPEARANCEUR CLEAR 02/21/2021 0235   LABSPEC 1.012 02/21/2021 0235   PHURINE 5.0 02/21/2021 0235   GLUCOSEU NEGATIVE 02/21/2021 0235   HGBUR NEGATIVE 02/21/2021 0235   BILIRUBINUR NEGATIVE 02/21/2021 0235   KETONESUR NEGATIVE 02/21/2021 0235   PROTEINUR NEGATIVE 02/21/2021 0235   NITRITE NEGATIVE 02/21/2021 0235   LEUKOCYTESUR SMALL (A) 02/21/2021 0235   Sepsis Labs Invalid input(s): PROCALCITONIN,  WBC,  LACTICIDVEN Microbiology Recent Results (from the past 240 hour(s))  Resp Panel by RT-PCR (Flu A&B, Covid) Nasopharyngeal Swab     Status: None   Collection Time: 03/26/21  5:53 PM   Specimen: Nasopharyngeal Swab; Nasopharyngeal(NP) swabs in vial transport medium  Result Value Ref Range Status   SARS Coronavirus 2 by RT PCR NEGATIVE NEGATIVE Final    Comment: (NOTE) SARS-CoV-2 target nucleic acids are NOT DETECTED.  The SARS-CoV-2 RNA is generally detectable in upper respiratory specimens during the acute phase of infection. The lowest concentration of SARS-CoV-2 viral copies this assay can detect is 138 copies/mL. A negative result does not preclude SARS-Cov-2 infection and should not be used as the sole basis for treatment or other patient management decisions. A negative result may occur with  improper specimen collection/handling, submission of specimen other than nasopharyngeal swab, presence of viral mutation(s) within the areas targeted by this assay, and inadequate number of viral copies(<138 copies/mL). A negative result must be combined with clinical observations, patient history, and epidemiological information. The expected result is Negative.  Fact Sheet for Patients:  BloggerCourse.com  Fact Sheet for Healthcare Providers:  SeriousBroker.it  This test is no t yet approved or cleared by the [place] FDA and  has been  authorized for detection and/or diagnosis of SARS-CoV-2 by FDA under an Emergency Use Authorization (EUA). This EUA will remain  in effect (meaning this test can be used) for the duration of the COVID-19 declaration under Section 564(b)(1) of the Act, 21 U.S.C.section 360bbb-3(b)(1), unless the authorization is terminated  or revoked sooner.       Influenza A by PCR NEGATIVE NEGATIVE Final   Influenza B by PCR NEGATIVE NEGATIVE Final    Comment: (NOTE) The Xpert Xpress SARS-CoV-2/FLU/RSV plus assay is intended as  an aid in the diagnosis of influenza from Nasopharyngeal swab specimens and should not be used as a sole basis for treatment. Nasal washings and aspirates are unacceptable for Xpert Xpress SARS-CoV-2/FLU/RSV testing.  Fact Sheet for Patients: BloggerCourse.com  Fact Sheet for Healthcare Providers: SeriousBroker.it  This test is not yet approved or cleared by the [place] FDA and has been authorized for detection and/or diagnosis of SARS-CoV-2 by FDA under an Emergency Use Authorization (EUA). This EUA will remain in effect (meaning this test can be used) for the duration of the COVID-19 declaration under Section 564(b)(1) of the Act, 21 U.S.C. section 360bbb-3(b)(1), unless the authorization is terminated or revoked.  Performed at [facility], [address] [place], [place] [address]   Wet prep, genital     Status: Abnormal   Collection Time: 03/28/21  3:30 AM  Result Value Ref Range Status   Yeast Wet Prep HPF POC NONE SEEN NONE SEEN Final   Trich, Wet Prep NONE SEEN NONE SEEN Final   Clue Cells Wet Prep HPF POC PRESENT (A) NONE SEEN Final   WBC, Wet Prep HPF POC MANY (A) NONE SEEN Final   Sperm NONE SEEN  Final    Comment: Performed at [facility], [address], [place], [place] [address]     Time coordinating discharge: 45 minutes  SIGNED:   Coralie Keens, MD  [facility] 03/28/2021, 11:43 AM

## 2021-03-28 NOTE — Care Management (Signed)
ED CM arranged transportation with [employer] thru [facility] transportation. Driver will call patient's cell phone to confirm ride. Patient had waiver on file.

## 2021-03-28 NOTE — Plan of Care (Signed)
Follow up appointment with cardiology Dr Izora Ribas arranged on 05/03/21. Message sent to coordinator [facility] to arrange the patient for EP appointment.

## 2021-03-28 NOTE — TOC Progression Note (Addendum)
Transition of Care [facility]) - Progression Note    Patient Details  Name: Kristin Elliott MRN: [id number] Date of Birth: 28-Jul-1976  Transition of [facility]) CM/SW Contact  Leone Haven, RN Phone Number: 03/28/2021, 12:35 PM  Clinical Narrative:    NCM spoke with patient, she has no insurance, she would like to do outpatient pt on [address]  She needs a rollator for charity, NCM made referral to [facility] with [employer], this will be brought up to her room prior to dc.  Patient states she will need transportation home and to outpatient pt apts also.  She has follow up at with the [place] [facility], listed on AVS.  NCM assisted with meds with Match letter and TOC to fill meds for her, she states she does not have any money to pay for meds.  Patient is now showing she ha Medicaid so TOC ran the meds thru her Medicaid and did not use the Match per Meagan.          Expected Discharge Plan and Services           Expected Discharge Date: 03/28/21                                     Social Determinants of Health (SDOH) Interventions    Readmission Risk Interventions No flowsheet data found.

## 2021-03-28 NOTE — Plan of Care (Signed)

## 2021-03-28 NOTE — Progress Notes (Addendum)
Progress Note  Patient Name: Kristin Elliott Date of Encounter: 03/28/2021  Primary Cardiologist: New to Izora Ribas MD  Subjective   Notes CP episode overnight that improved with Xanax.  Patient is tearful and worried abotu finding answer to her chest pain, leg pain and weakness.  Notes that her Parkinson's Disease is acting up and she is worried that his is not getting treatment.  Inpatient Medications    Scheduled Meds:  aspirin EC  81 mg Oral Daily   clotrimazole  1 Applicatorful Vaginal QHS   cyanocobalamin  1,000 mcg Intramuscular Daily   Followed by   Melene Muller ON 04/03/2021] vitamin B-12  1,000 mcg Oral Daily   docusate sodium  100 mg Oral BID   enoxaparin (LOVENOX) injection  40 mg Subcutaneous Q24H   levothyroxine  50 mcg Oral Q0600   methocarbamol  500 mg Oral TID   metoprolol tartrate  50 mg Oral Once   midodrine  5 mg Oral BID WC   Continuous Infusions:  sodium chloride     PRN Meds: acetaminophen **OR** acetaminophen, albuterol, fentaNYL (SUBLIMAZE) injection, LORazepam, ondansetron **OR** ondansetron (ZOFRAN) IV, oxyCODONE   Vital Signs    Vitals:   03/27/21 1954 03/28/21 0100 03/28/21 0330 03/28/21 0754  BP: 109/63  (!) 103/59 108/76  Pulse: 61  60 61  Resp:   16 16  Temp: 98.7 F (37.1 C)  97.6 F (36.4 C) 98 F (36.7 C)  TempSrc: Oral  Oral Oral  SpO2:   98% 98%  Weight:  114.3 kg    Height:        Intake/Output Summary (Last 24 hours) at 03/28/2021 1014 Last data filed at 03/28/2021 0900 Gross per 24 hour  Intake 832 ml  Output 2500 ml  Net -1668 ml   Filed Weights   03/26/21 2302 03/27/21 0331 03/28/21 0100  Weight: 113.3 kg 113.4 kg 114.3 kg    Telemetry    SR to A pacing (rare)- Personally Reviewed  ECG    No new sin 03/27/21- Personally Reviewed  Physical Exam   GEN: No acute distress.   Neck: No JVD Cardiac: RRR, no murmurs, rubs, or gallops.  Respiratory: Clear to auscultation bilaterally. GI: Soft, nontender,  non-distended  MS: No edema; No deformity. Neuro:  Nonfocal  Psych: Normal affect   Labs    Chemistry Recent Labs  Lab 03/26/21 1029 03/27/21 0210 03/28/21 0234  NA 141 141 136  K 3.7 3.3* 4.0  CL 109 110 107  CO2 25 25 23   GLUCOSE 97 100* 81  BUN 15 14 14   CREATININE 1.03* 1.06* 1.09*  CALCIUM 8.9 8.9 8.6*  PROT  --  6.7  --   ALBUMIN  --  3.4*  --   AST  --  17  --   ALT  --  16  --   ALKPHOS  --  79  --   BILITOT  --  0.4  --   GFRNONAA >60 >60 >60  ANIONGAP 7 6 6      Hematology Recent Labs  Lab 03/24/21 1347 03/26/21 1029 03/27/21 0210  WBC 5.5 4.0 4.0  RBC 3.72* 3.77* 3.56*  HGB 11.3* 11.3* 11.1*  HCT 34.4* 34.8* 32.3*  MCV 92.5 92.3 90.7  MCH 30.4 30.0 31.2  MCHC 32.8 32.5 34.4  RDW 13.2 13.3 13.4  PLT 212 205 184    Cardiac EnzymesNo results for input(s): TROPONINI in the last 168 hours. No results for input(s): TROPIPOC in the last 168  hours.   BNPNo results for input(s): BNP, PROBNP in the last 168 hours.   DDimer No results for input(s): DDIMER in the last 168 hours.   Radiology    DG Chest 2 View  Result Date: 03/26/2021 CLINICAL DATA:  Chest pain. Additional provided: Patient reports headache for 1 month, chest pain, nausea/vomiting. EXAM: CHEST - 2 VIEW COMPARISON:  Prior chest radiographs 02/19/2021. FINDINGS: Redemonstrated left chest dual lead implantable cardiac device. Heart size within normal limits. No appreciable airspace consolidation. No evidence of pleural effusion or pneumothorax. No acute bony abnormality identified. IMPRESSION: No evidence of acute cardiopulmonary abnormality. Electronically Signed   By: Jackey Loge DO   On: 03/26/2021 11:02   DG Abd 1 View  Result Date: 03/27/2021 CLINICAL DATA:  Piercing that was swallowed 1 month ago EXAM: ABDOMEN - 1 VIEW COMPARISON:  CT 03/01/2021 FINDINGS: Nonobstructive bowel gas pattern. There is contrast material within the renal collecting system and bladder. Degenerative disc disease  at L4-L5. There are multiple surgical clips overlying the upper central abdomen and right upper quadrant. Dual chamber pacemaker leads are seen. No other metallic foreign bodies noted. IMPRESSION: Multiple surgical clips overlying the upper central abdomen and right upper quadrant. Dual chamber pacemaker leads noted. No other metallic foreign bodies identified overlying the abdomen, though note the rectum is obscured by retained contrast material within the bladder. Electronically Signed   By: Caprice Renshaw   On: 03/27/2021 15:05   MR ANGIO HEAD WO CONTRAST  Result Date: 03/27/2021 CLINICAL DATA:  Headache, new or worsening, neuro deficit (Age 29-49y). EXAM: MRA HEAD WITHOUT CONTRAST TECHNIQUE: Angiographic images of the Circle of Willis were acquired using MRA technique without intravenous contrast. COMPARISON:  No pertinent prior exam. FINDINGS: Anterior circulation: The internal carotid arteries are widely patent from skull base to carotid termini. ACAs and MCAs are patent without evidence of a proximal branch occlusion or significant proximal stenosis. No aneurysm is identified. Posterior circulation: The intracranial vertebral arteries are widely patent to the basilar with the left being dominant. The right PICA and left AICA are dominant. Patent SCAs are seen bilaterally. The basilar artery is widely patent. Posterior communicating arteries are small or absent. Both PCAs are patent without evidence of a significant proximal stenosis. No aneurysm is identified. Anatomic variants: None. IMPRESSION: Negative head MRA. Electronically Signed   By: Sebastian Ache M.D.   On: 03/27/2021 17:33   MR BRAIN W WO CONTRAST  Result Date: 03/27/2021 CLINICAL DATA:  Headache, new or worsening, neuro deficit (Age 76-49y). EXAM: MRI HEAD WITHOUT AND WITH CONTRAST TECHNIQUE: Multiplanar, multiecho pulse sequences of the brain and surrounding structures were obtained without and with intravenous contrast. CONTRAST:  10mL  GADAVIST GADOBUTROL 1 MMOL/ML IV SOLN COMPARISON:  Head CT 03/24/2021 FINDINGS: Brain: There is no evidence of an acute infarct, intracranial hemorrhage, midline shift, or extra-axial fluid collection. The ventricles and sulci are normal. There is a single small focus of T2 FLAIR hyperintensity in the subcortical white matter of the lateral right frontal lobe, nonspecific and likely of no clinical significance. On sagittal images, the pituitary gland appears asymmetrically enlarged on the left with a height of 8 mm, slightly convex superior margin, and mild depression of the left sellar floor, however the gland appears more symmetric on coronal images aside from left eccentric positioning related to the sellar floor depression. The infundibulum inserts into the central aspect of the pituitary gland (slightly left of midline) with lack of rightward displacement arguing against an  underlying left-sided mass. Pituitary gland enhancement is homogeneous on this nondedicated study, and the gland also demonstrates grossly homogeneous T2 signal. The overall size of the gland is within normal limits for a female of this age. Vascular: Major intracranial vascular flow voids are preserved. Skull and upper cervical spine: Unremarkable bone marrow signal. Sinuses/Orbits: Unremarkable orbits. Mild mucosal thickening in the maxillary sinuses. No significant mastoid fluid. Other: None. IMPRESSION: 1. No acute intracranial abnormality. 2. Pituitary/sellar asymmetry as detailed above. This may reflect a mildly prominent but normal gland for a female of this age with developmental asymmetry of the sellar floor, or alternatively there could be an underlying pituitary adenoma although one is not clearly demonstrated on this nondedicated study. 3. Otherwise largely unremarkable appearance of the brain. Electronically Signed   By: Sebastian Ache M.D.   On: 03/27/2021 17:25   MR CERVICAL SPINE W WO CONTRAST  Result Date:  03/27/2021 CLINICAL DATA:  Neck pain, chronic, degenerative changes on xray. EXAM: MRI CERVICAL SPINE WITHOUT AND WITH CONTRAST TECHNIQUE: Multiplanar and multiecho pulse sequences of the cervical spine, to include the craniocervical junction and cervicothoracic junction, were obtained without and with intravenous contrast. CONTRAST:  69mL GADAVIST GADOBUTROL 1 MMOL/ML IV SOLN COMPARISON:  None. FINDINGS: Alignment: Normal. Vertebrae: No fracture, suspicious marrow lesion, or significant marrow edema. Cord: Normal cord signal and morphology. No abnormal intradural enhancement. Posterior Fossa, vertebral arteries, paraspinal tissues: Unremarkable. Disc levels: C2-3: Negative. C3-4: Mild disc bulging and uncovertebral spurring result in mild bilateral neural foraminal stenosis without spinal stenosis. C4-5: Asymmetric right uncovertebral spurring results in mild right neural foraminal stenosis without spinal stenosis. C5-6: Small right paracentral disc protrusion without stenosis. C6-7: Negative. C7-T1: Negative. IMPRESSION: 1. Mild cervical spondylosis with mild neural foraminal stenosis at C3-4 and C4-5. 2. Small right paracentral disc protrusion at C5-6 without stenosis. 3. Normal appearance of the cervical spinal cord. Electronically Signed   By: Sebastian Ache M.D.   On: 03/27/2021 17:36   CT CORONARY MORPH W/CTA COR W/SCORE W/CA W/CM &/OR WO/CM  Addendum Date: 03/27/2021   ADDENDUM REPORT: 03/27/2021 14:17 EXAM: OVER-READ INTERPRETATION  CT CHEST The following report is an over-read performed by radiologist Dr. Jeronimo Greaves of [facility] on 03/27/2021. This over-read does not include interpretation of cardiac or coronary anatomy or pathology. The coronary CTA interpretation by the cardiologist is attached. COMPARISON:  Chest radiograph 1 day prior. FINDINGS: Vascular: Aortic atherosclerosis. No central pulmonary embolism, on this non-dedicated study. Mediastinum/Nodes: No imaged thoracic adenopathy.  Small hiatal hernia with surgical sutures in the proximal stomach. Lungs/Pleura: No pleural fluid. Tiny subpleural nodules in the right middle lobe are likely subpleural lymph nodes at maximally 2 mm. 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 Normal imaged portions of the spleen. Musculoskeletal: No acute osseous abnormality. IMPRESSION: 1. No acute findings in the imaged extracardiac chest. 2. Aortic Atherosclerosis (ICD10-I70.0). 3. Small hiatal hernia. Electronically Signed   By: Jeronimo Greaves M.D.   On: 03/27/2021 14:17   Result Date: 03/27/2021 CLINICAL DATA:  74F with chest pain EXAM: Cardiac/Coronary CTA TECHNIQUE: The patient was scanned on a [employer]. FINDINGS: A 100 kV prospective scan was triggered in the descending thoracic aorta at 111 HU's. Axial non-contrast 3 mm slices were carried out through the heart. The data set was analyzed on a dedicated work station and scored using the Agatson method. Gantry rotation speed was 250 msecs and collimation was .6 mm. 0.8 mg of sl NTG was given.  The 3D data set was reconstructed in 5% intervals of the 35-75 % of the R-R cycle. Phases were analyzed on a dedicated work station using MPR, MIP and VRT modes. The patient received 80 cc of contrast. Coronary Arteries:  Normal coronary origin.  Right dominance. RCA is a large dominant artery that gives rise to PDA and PLA. There is no plaque. Left main is a large artery that gives rise to LAD and LCX arteries. LAD is a large vessel that has no plaque. Mid LAD myocardial bridge measuring 18mm LCX is a non-dominant artery that gives rise to one large OM1 branch. There is no plaque. Other findings: Left Ventricle: Normal size Left Atrium: Mild enlargement Pulmonary Veins: Normal configuration Right Ventricle: Mild dilatation Right Atrium: Mild enlargement Cardiac valves: No calcifications Thoracic aorta: Normal size Pulmonary Arteries: Dilated main PA measuring 31mm Systemic Veins: Normal  drainage Pericardium: Normal thickness IMPRESSION: 1. Coronary calcium score of 0. 2. Normal coronary origin with right dominance. 3. No evidence of CAD. 4. Mid LAD myocardial bridge measuring 18mm CAD-RADS 0. No evidence of CAD (0%). Consider non-atherosclerotic causes of chest pain. Electronically Signed: By: Epifanio Lescheshristopher  Schumann MD On: 03/27/2021 13:26   MR MRV HEAD W WO CONTRAST  Result Date: 03/27/2021 CLINICAL DATA:  Worsening headaches. EXAM: MR VENOGRAM HEAD WITHOUT AND WITH CONTRAST TECHNIQUE: Angiographic images of the intracranial venous structures were acquired using MRV technique without and with intravenous contrast. CONTRAST:  10mL GADAVIST GADOBUTROL 1 MMOL/ML IV SOLN COMPARISON:  Same day MRI. FINDINGS: No evidence of dural venous sinus thrombosis. The superior sagittal sinus, transverse sinuses, sigmoid sinuses, jugular bulbs, Street sinus, and visualized deep cerebral veins are patent. Symmetric opacification of the cavernous sinuses. IMPRESSION: No evidence of dural venous sinus thrombosis. Electronically Signed   By: Feliberto HartsFrederick S Jones MD   On: 03/27/2021 18:19    Cardiac Studies   Transthoracic Echocardiogram: Date: 02/21/21 Results:  1. Left ventricular ejection fraction, by estimation, is 60 to 65%. The  left ventricle has normal function. The left ventricle has no regional  wall motion abnormalities. Left ventricular diastolic parameters were  normal.   2. Right ventricular systolic function is normal. The right ventricular  size is normal. Tricuspid regurgitation signal is inadequate for assessing  PA pressure.   3. The mitral valve is normal in structure. Trivial mitral valve  regurgitation. No evidence of mitral stenosis.   4. The aortic valve is tricuspid. Aortic valve regurgitation is not  visualized. No aortic stenosis is present.   5. The inferior vena cava is normal in size with greater than 50%  respiratory variability, suggesting right atrial pressure of 3 mmHg.    Patient Profile     45 y.o. female with Morbid Obesity s/p gastric sleeve; Bradycardia and chronotropic incompetence s/p PPM  Assessment & Plan    Morbid Obesity S/p PPM with occasional A pacing Non Cardiac CP with myocardial bridging -Given low heart rate, myocardial bridge is non likely contributing to her symptoms; no coronary calcium  Per patient request will change to normal diet.  [facility] will sign off.   Medication Recommendations:  None Other recommendations (labs, testing, etc):  NA Follow up as an outpatient:  We are working to arrange follow up both with me and with EP for device mgmt  Time Spent Directly with Patient:   I have spent a total of 35 minutes with the patient reviewing notes, imaging, EKGs, labs and examining the patient as well as establishing an assessment  and plan that was discussed personally with the patient.  > 50% of time was spent in direct patient care .   For questions or updates, please contact [facility] Please consult [email or web address] for contact info under Cardiology/STEMI.      Signed, Christell Constant, MD  03/28/2021, 10:25 AM

## 2021-03-28 NOTE — Social Work (Signed)
CSW provided mental health resources to pt and explained that there are teletherapy options since she is not comfortable going to the office in fear of COVID.

## 2021-03-29 ENCOUNTER — Emergency Department (HOSPITAL_COMMUNITY)
Admission: EM | Admit: 2021-03-29 | Discharge: 2021-03-30 | Disposition: A | Discharge status: Home / Self Care | Payer: Medicare (Managed Care) | Attending: Emergency Medicine | Admitting: Emergency Medicine

## 2021-03-29 DIAGNOSIS — Z950 Presence of cardiac pacemaker: Secondary | ICD-10-CM | POA: Insufficient documentation

## 2021-03-29 DIAGNOSIS — R4182 Altered mental status, unspecified: Secondary | ICD-10-CM | POA: Diagnosis not present

## 2021-03-29 DIAGNOSIS — T3691XA Poisoning by unspecified systemic antibiotic, accidental (unintentional), initial encounter: Secondary | ICD-10-CM | POA: Insufficient documentation

## 2021-03-29 DIAGNOSIS — T424X1A Poisoning by benzodiazepines, accidental (unintentional), initial encounter: Secondary | ICD-10-CM | POA: Insufficient documentation

## 2021-03-29 DIAGNOSIS — T887XXA Unspecified adverse effect of drug or medicament, initial encounter: Secondary | ICD-10-CM

## 2021-03-29 DIAGNOSIS — T466X1A Poisoning by antihyperlipidemic and antiarteriosclerotic drugs, accidental (unintentional), initial encounter: Secondary | ICD-10-CM | POA: Insufficient documentation

## 2021-03-29 DIAGNOSIS — Y900 Blood alcohol level of less than 20 mg/100 ml: Secondary | ICD-10-CM | POA: Diagnosis not present

## 2021-03-29 DIAGNOSIS — Z20822 Contact with and (suspected) exposure to covid-19: Secondary | ICD-10-CM | POA: Insufficient documentation

## 2021-03-29 DIAGNOSIS — R531 Weakness: Secondary | ICD-10-CM | POA: Diagnosis not present

## 2021-03-29 LAB — GC/CHLAMYDIA PROBE AMP (~~LOC~~) NOT AT ARMC
Chlamydia: NEGATIVE
Comment: NEGATIVE
Comment: NORMAL
Neisseria Gonorrhea: NEGATIVE

## 2021-03-29 LAB — VITAMIN B1: Vitamin B1 (Thiamine): 90.2 nmol/L (ref 66.5–200.0)

## 2021-03-29 LAB — COMPREHENSIVE METABOLIC PANEL
ALT: 15 U/L (ref 0–44)
AST: 24 U/L (ref 15–41)
Albumin: 3.9 g/dL (ref 3.5–5.0)
Alkaline Phosphatase: 87 U/L (ref 38–126)
Anion gap: 8 (ref 5–15)
BUN: 19 mg/dL (ref 6–20)
CO2: 23 mmol/L (ref 22–32)
Calcium: 8.7 mg/dL — ABNORMAL LOW (ref 8.9–10.3)
Chloride: 108 mmol/L (ref 98–111)
Creatinine, Ser: 1.08 mg/dL — ABNORMAL HIGH (ref 0.44–1.00)
GFR, Estimated: >60 mL/min (ref >60)
Glucose, Bld: 116 mg/dL — ABNORMAL HIGH (ref 70–99)
Potassium: 3.7 mmol/L (ref 3.5–5.1)
Sodium: 139 mmol/L (ref 135–145)
Total Bilirubin: 0.4 mg/dL (ref 0.3–1.2)
Total Protein: 7 g/dL (ref 6.5–8.1)

## 2021-03-29 LAB — I-STAT BETA HCG BLOOD, ED (MC, WL, AP ONLY): I-stat hCG, quantitative: <5 m[IU]/mL (ref <5)

## 2021-03-29 LAB — COPPER, SERUM: Copper: 104 ug/dL (ref 80–158)

## 2021-03-29 LAB — SALICYLATE LEVEL: Salicylate Lvl: <7 mg/dL — ABNORMAL LOW (ref 7.0–30.0)

## 2021-03-29 LAB — ZINC: Zinc: 66 ug/dL (ref 44–115)

## 2021-03-29 LAB — ETHANOL: Alcohol, Ethyl (B): <10 mg/dL (ref <10)

## 2021-03-29 LAB — ACETAMINOPHEN LEVEL: Acetaminophen (Tylenol), Serum: <10 ug/mL — ABNORMAL LOW (ref 10–30)
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
## 2021-03-30 DIAGNOSIS — T424X1A Poisoning by benzodiazepines, accidental (unintentional), initial encounter: Secondary | ICD-10-CM | POA: Diagnosis not present

## 2021-03-30 LAB — CBC WITH DIFFERENTIAL/PLATELET
Abs Immature Granulocytes: 0 10*3/uL (ref 0.00–0.07)
Basophils Absolute: 0 10*3/uL (ref 0.0–0.1)
Basophils Relative: 0 %
Eosinophils Absolute: 0.1 10*3/uL (ref 0.0–0.5)
Eosinophils Relative: 2 %
HCT: 33.4 % — ABNORMAL LOW (ref 36.0–46.0)
Hemoglobin: 11 g/dL — ABNORMAL LOW (ref 12.0–15.0)
Immature Granulocytes: 0 %
Lymphocytes Relative: 40 %
Lymphs Abs: 1.7 10*3/uL (ref 0.7–4.0)
MCH: 30.2 pg (ref 26.0–34.0)
MCHC: 32.9 g/dL (ref 30.0–36.0)
MCV: 91.8 fL (ref 80.0–100.0)
Monocytes Absolute: 0.4 10*3/uL (ref 0.1–1.0)
Monocytes Relative: 9 %
Neutro Abs: 2.1 10*3/uL (ref 1.7–7.7)
Neutrophils Relative %: 49 %
Platelets: 200 10*3/uL (ref 150–400)
RBC: 3.64 MIL/uL — ABNORMAL LOW (ref 3.87–5.11)
RDW: 13.2 % (ref 11.5–15.5)
WBC: 4.2 10*3/uL (ref 4.0–10.5)
nRBC: 0 % (ref 0.0–0.2)

## 2021-03-30 LAB — CBG MONITORING, ED: Glucose-Capillary: 94 mg/dL (ref 70–99)

## 2021-03-30 LAB — RAPID URINE DRUG SCREEN, HOSP PERFORMED
Amphetamines: NOT DETECTED
Barbiturates: NOT DETECTED
Benzodiazepines: POSITIVE — AB
Cocaine: NOT DETECTED
Opiates: NOT DETECTED
Tetrahydrocannabinol: NOT DETECTED

## 2021-03-30 LAB — VITAMIN A: Vitamin A (Retinoic Acid): 43 ug/dL (ref 20.1–62.0)

## 2021-03-30 LAB — RESP PANEL BY RT-PCR (FLU A&B, COVID) ARPGX2
Influenza A by PCR: NEGATIVE
Influenza B by PCR: NEGATIVE
SARS Coronavirus 2 by RT PCR: NEGATIVE

## 2021-03-30 LAB — VITAMIN E
Vitamin E (Alpha Tocopherol): 10.3 mg/L (ref 7.0–25.1)
Vitamin E(Gamma Tocopherol): 2.8 mg/L (ref 0.5–5.5)

## 2021-03-30 MED ORDER — LORAZEPAM 1 MG PO TABS: 0.5000 mg | ORAL_TABLET | Freq: Four times a day (QID) | ORAL | 0 refills | Status: AC | PRN

## 2021-03-30 MED ORDER — GABAPENTIN 100 MG PO CAPS: 100.0000 mg | ORAL_CAPSULE | Freq: Three times a day (TID) | ORAL | 0 refills | Status: AC

## 2021-03-30 MED ORDER — METHOCARBAMOL 500 MG PO TABS: 500.0000 mg | ORAL_TABLET | Freq: Three times a day (TID) | ORAL | 0 refills | Status: AC | PRN

## 2021-03-30 NOTE — Discharge Instructions (Addendum)
I think your symptoms were related to starting a couple of medications at a very high dose.  I have amended those dosages on your paperwork.  You will need to get a new prescription for the Neurontin.  Please only take the Robaxin if he absolutely needed.  Please only take the Ativan if he absolutely need it.  The Neurontin is a medication that you need to be taken regularly for her to have a good effect.  You can slowly increase this from the dose some given you up to the dose that your doctor prescribed however that needs to be done with your doctor's supervision and recommendations.
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
## 2021-04-05 LAB — VITAMIN K1, SERUM: VITAMIN K1: 0.49 ng/mL (ref 0.10–2.20)

## 2021-05-02 ENCOUNTER — Telehealth (INDEPENDENT_AMBULATORY_CARE_PROVIDER_SITE_OTHER): Payer: Self-pay | Admitting: Primary Care

## 2021-05-03 ENCOUNTER — Ambulatory Visit: Payer: Medicare Other | Admitting: Internal Medicine

## 2021-05-03 NOTE — Progress Notes (Deleted)
Cardiology Office Note:    Date:  05/03/2021   ID:  Kristin Elliott, DOB October 03, 1975, MRN [id number]  PCP:  Default, Provider, MD   [facility] Providers Cardiologist:  None { Click to update primary MD,subspecialty MD or APP then REFRESH:1}    Referring MD: No ref. provider found   CC:  Follow up ED eval  History of Present Illness:    Kristin Elliott is a 45 y.o. female with a hx of Bradycardia s/p PPM, multiple CP eval  seen 03/26/21 for CP.  In interim of this visit, patient had negative cardiac CT ( incidental myocardial bridge).  Seen 05/03/21.  Patient notes that she is doing ***.  Since day prior/last visit notes *** changes.  Relevant interval testing or therapy include ***.  There are no*** interval hospital/ED visit.    No chest pain or pressure ***.  No SOB/DOE*** and no PND/Orthopnea***.  No weight gain or leg swelling***.  No palpitations or syncope ***.  Ambulatory blood pressure ***.     Past Medical History:  Diagnosis Date   GERD without esophagitis 02/19/2021   Presence of cardiac pacemaker 02/19/2021   Schizoaffective disorder, bipolar type (HCC) 02/19/2021    Past Surgical History:  Procedure Laterality Date   ABDOMINAL HYSTERECTOMY     ABDOMINAL SURGERY     HERNIA REPAIR     PACEMAKER IMPLANT  08/04/2019   [employer]tr[employer]    Current Medications: No outpatient medications have been marked as taking for the 05/03/21 encounter (Appointment) with Christell Constant, MD.     Allergies:   Morphine and related, Doxycycline, Lasix [furosemide], Metoclopramide, Penicillins, Sulfa antibiotics, Toradol [ketorolac tromethamine], and Tramadol   Social History   Socioeconomic History   Marital status: Significant Other    Spouse name: Not on file   Number of children: Not on file   Years of education: Not on file   Highest education level: Not on file  Occupational History   Not on file  Tobacco Use   Smoking status: Never   Smokeless  tobacco: Never  Substance and Sexual Activity   Alcohol use: Not Currently   Drug use: Never   Sexual activity: Not on file  Other Topics Concern   Not on file  Social History Narrative   Not on file   Social Determinants of Health   Financial Resource Strain: Not on file  Food Insecurity: Not on file  Transportation Needs: Not on file  Physical Activity: Not on file  Stress: Not on file  Social Connections: Not on file     Family History: History of coronary artery disease notable for daughter (related to substance use) and grandmother.  ROS:   Please see the history of present illness.     All other systems reviewed and are negative.  EKGs/Labs/Other Studies Reviewed:    The following studies were reviewed today:   EKG:  EKG is *** ordered today.  The ekg ordered today demonstrates *** 05/03/21: ***   Cardiac CT: Date: 03/27/21 Results: IMPRESSION: 1. Coronary calcium score of 0.   2. Normal coronary origin with right dominance.   3. No evidence of CAD.   4. Mid LAD myocardial bridge measuring 83mm   CAD-RADS 0. No evidence of CAD (0%). Consider non-atherosclerotic causes of chest pain. 5. Aortic Atherosclerosis      Recent Labs: 03/27/2021: Magnesium 2.1; TSH 37.711 03/29/2021: ALT 15; BUN 19; Creatinine, Ser 1.08; Hemoglobin 11.0; Platelets 200; Potassium 3.7; Sodium 139  Recent  Lipid Panel    Component Value Date/Time   CHOL 187 03/27/2021 0210   TRIG 170 (H) 03/27/2021 0210   HDL 43 03/27/2021 0210   CHOLHDL 4.3 03/27/2021 0210   VLDL 34 03/27/2021 0210   LDLCALC 110 (H) 03/27/2021 0210     Physical Exam:    VS:  There were no vitals taken for this visit.    Wt Readings from Last 3 Encounters:  03/28/21 251 lb 14.4 oz (114.3 kg)  03/24/21 240 lb (108.9 kg)  03/02/21 241 lb (109.3 kg)     GEN: *** Well nourished, well developed in no acute distress HEENT: Normal NECK: No JVD; No carotid bruits LYMPHATICS: No lymphadenopathy CARDIAC:  ***RRR, no murmurs, rubs, gallops RESPIRATORY:  Clear to auscultation without rales, wheezing or rhonchi  ABDOMEN: Soft, non-tender, non-distended MUSCULOSKELETAL:  No edema; No deformity  SKIN: Warm and dry NEUROLOGIC:  Alert and oriented x 3 PSYCHIATRIC:  Normal affect   ASSESSMENT:    No diagnosis found. PLAN:    Non-cardiac CP  S/p PPM ***   {Are you ordering a CV Procedure (e.g. stress test, cath, DCCV, TEE, etc)?   Press F2        :[id number]}    Medication Adjustments/Labs and Tests Ordered: Current medicines are reviewed at length with the patient today.  Concerns regarding medicines are outlined above.  No orders of the defined types were placed in this encounter.  No orders of the defined types were placed in this encounter.   There are no Patient Instructions on file for this visit.   Signed, Christell Constant, MD  05/03/2021 8:30 AM    [facility]

## 2021-05-07 NOTE — Progress Notes (Deleted)
Electrophysiology Office Note:    Date:  05/07/2021   ID:  Kristin Elliott, DOB [birth year], MRN [id number]  PCP:  Default, Provider, MD  [facility] Cardiologist:  None  [facility] Electrophysiologist:  Lanier Prude, MD   Referring MD: No ref. provider found   Chief Complaint: PPM care  History of Present Illness:    Kristin Elliott is a 45 y.o. female who presents for an evaluation of her pacemaker at the request of Dr Izora Ribas. Their medical history includes schizoaffective disorder, PPM for chronotropic competence.  She presents today to establish care for her pacemaker.  Past Medical History:  Diagnosis Date   GERD without esophagitis 02/19/2021   Presence of cardiac pacemaker 02/19/2021   Schizoaffective disorder, bipolar type (HCC) 02/19/2021    Past Surgical History:  Procedure Laterality Date   ABDOMINAL HYSTERECTOMY     ABDOMINAL SURGERY     HERNIA REPAIR     PACEMAKER IMPLANT  08/04/2019   [employer]tr[employer]    Current Medications: No outpatient medications have been marked as taking for the 05/08/21 encounter (Appointment) with Lanier Prude, MD.     Allergies:   Morphine and related, Doxycycline, Lasix [furosemide], Metoclopramide, Penicillins, Sulfa antibiotics, Toradol [ketorolac tromethamine], and Tramadol   Social History   Socioeconomic History   Marital status: Significant Other    Spouse name: Not on file   Number of children: Not on file   Years of education: Not on file   Highest education level: Not on file  Occupational History   Not on file  Tobacco Use   Smoking status: Never   Smokeless tobacco: Never  Substance and Sexual Activity   Alcohol use: Not Currently   Drug use: Never   Sexual activity: Not on file  Other Topics Concern   Not on file  Social History Narrative   Not on file   Social Determinants of Health   Financial Resource Strain: Not on file  Food Insecurity: Not on file  Transportation  Needs: Not on file  Physical Activity: Not on file  Stress: Not on file  Social Connections: Not on file     Family History: The patient's family history is not on file.  ROS:   Please see the history of present illness.    All other systems reviewed and are negative.  EKGs/Labs/Other Studies Reviewed:    The following studies were reviewed today: ***  EKG:  The ekg ordered today demonstrates ***  Recent Labs: 03/27/2021: Magnesium 2.1; TSH 37.711 03/29/2021: ALT 15; BUN 19; Creatinine, Ser 1.08; Hemoglobin 11.0; Platelets 200; Potassium 3.7; Sodium 139  Recent Lipid Panel    Component Value Date/Time   CHOL 187 03/27/2021 0210   TRIG 170 (H) 03/27/2021 0210   HDL 43 03/27/2021 0210   CHOLHDL 4.3 03/27/2021 0210   VLDL 34 03/27/2021 0210   LDLCALC 110 (H) 03/27/2021 0210    Physical Exam:    VS:  There were no vitals taken for this visit.    Wt Readings from Last 3 Encounters:  03/28/21 251 lb 14.4 oz (114.3 kg)  03/24/21 240 lb (108.9 kg)  03/02/21 241 lb (109.3 kg)     GEN: *** Well nourished, well developed in no acute distress HEENT: Normal NECK: No JVD; No carotid bruits LYMPHATICS: No lymphadenopathy CARDIAC: ***RRR, no murmurs, rubs, gallops RESPIRATORY:  Clear to auscultation without rales, wheezing or rhonchi  ABDOMEN: Soft, non-tender, non-distended MUSCULOSKELETAL:  No edema; No deformity  SKIN: Warm and  dry NEUROLOGIC:  Alert and oriented x 3 PSYCHIATRIC:  Normal affect   ASSESSMENT:    No diagnosis found. PLAN:    In order of problems listed above:         Total time spent with patient today *** minutes. This includes reviewing records, evaluating the patient and coordinating care.  Medication Adjustments/Labs and Tests Ordered: Current medicines are reviewed at length with the patient today.  Concerns regarding medicines are outlined above.  No orders of the defined types were placed in this encounter.  No orders of the defined  types were placed in this encounter.    Signed, Rossie Muskrat. Lalla Brothers, MD, [facility], [facility] 05/07/2021 10:15 PM    Electrophysiology [facility]

## 2021-05-08 ENCOUNTER — Encounter: Payer: Medicare Other | Admitting: Cardiology

## 2022-04-16 ENCOUNTER — Encounter (HOSPITAL_COMMUNITY): Payer: Self-pay

## 2022-04-16 ENCOUNTER — Other Ambulatory Visit: Payer: Self-pay

## 2022-04-16 ENCOUNTER — Emergency Department (HOSPITAL_COMMUNITY): Payer: Medicare Other

## 2022-04-16 ENCOUNTER — Emergency Department (HOSPITAL_COMMUNITY)
Admission: EM | Admit: 2022-04-16 | Discharge: 2022-04-17 | Disposition: A | Discharge status: Home / Self Care | Payer: Medicare Other | Attending: Emergency Medicine | Admitting: Emergency Medicine

## 2022-04-16 DIAGNOSIS — R079 Chest pain, unspecified: Secondary | ICD-10-CM | POA: Insufficient documentation

## 2022-04-16 DIAGNOSIS — D649 Anemia, unspecified: Secondary | ICD-10-CM | POA: Diagnosis not present

## 2022-04-16 DIAGNOSIS — R0789 Other chest pain: Secondary | ICD-10-CM

## 2022-04-16 HISTORY — DX: Reserved for concepts with insufficient information to code with codable children: IMO0002

## 2022-04-16 HISTORY — DX: Systemic lupus erythematosus, unspecified: M32.9

## 2022-04-16 LAB — CBC
HCT: 32.7 % — ABNORMAL LOW (ref 36.0–46.0)
Hemoglobin: 11 g/dL — ABNORMAL LOW (ref 12.0–15.0)
MCH: 30.5 pg (ref 26.0–34.0)
MCHC: 33.6 g/dL (ref 30.0–36.0)
MCV: 90.6 fL (ref 80.0–100.0)
Platelets: 184 10*3/uL (ref 150–400)
RBC: 3.61 MIL/uL — ABNORMAL LOW (ref 3.87–5.11)
RDW: 13.8 % (ref 11.5–15.5)
WBC: 4.6 10*3/uL (ref 4.0–10.5)
nRBC: 0 % (ref 0.0–0.2)

## 2022-04-16 LAB — BASIC METABOLIC PANEL
Anion gap: 9 (ref 5–15)
BUN: 26 mg/dL — ABNORMAL HIGH (ref 6–20)
CO2: 18 mmol/L — ABNORMAL LOW (ref 22–32)
Calcium: 8.9 mg/dL (ref 8.9–10.3)
Chloride: 112 mmol/L — ABNORMAL HIGH (ref 98–111)
Creatinine, Ser: 1.15 mg/dL — ABNORMAL HIGH (ref 0.44–1.00)
GFR, Estimated: 59 mL/min — ABNORMAL LOW (ref >60)
Glucose, Bld: 102 mg/dL — ABNORMAL HIGH (ref 70–99)
Potassium: 3.9 mmol/L (ref 3.5–5.1)
Sodium: 139 mmol/L (ref 135–145)

## 2022-04-16 LAB — TROPONIN I (HIGH SENSITIVITY): Troponin I (High Sensitivity): 2 ng/L (ref <18)

## 2022-04-16 NOTE — ED Provider Triage Note (Signed)
Emergency Medicine Provider Triage Evaluation Note  Kristin Elliott , a 46 y.o. female  was evaluated in triage.  Pt complains of left-sided chest pain rated 8 out of 10 in severity, sharp, radiating down the left arm which began earlier this evening.  Patient states earlier today she had mild chest pain which evolved into palpitations and more severe chest pain this evening.  Patient does have a demand pacemaker due to previous history of bradycardia.  Patient endorses shortness of breath coinciding with the chest pain.  Patient also states that she has had recent flares of pain due to a hiatal hernia  Review of Systems  Positive: As above Negative: As above  Physical Exam  BP 110/71 (BP Location: Right Arm)   Pulse 60   Temp 98.1 F (36.7 C) (Oral)   Resp 18   Ht 5\' 9"  (1.753 m)   Wt 99.8 kg   SpO2 97%   BMI 32.49 kg/m  Gen:   Awake, no distress   Resp:  Normal effort  MSK:   Moves extremities without difficulty  Other:    Medical Decision Making  Medically screening exam initiated at 9:23 PM.  Appropriate orders placed.  Ronelle Lyndall Bellot was informed that the remainder of the evaluation will be completed by another provider, this initial triage assessment does not replace that evaluation, and the importance of remaining in the ED until their evaluation is complete.     [name] 04/16/22 2124

## 2022-04-16 NOTE — ED Triage Notes (Signed)
Pt reports with chest pain and abdominal pain that started today. Pt reports having a hiatal hernia and states that this is what it felt like before. Pt reports being nauseous and vomiting along with having diarrhea.

## 2022-04-17 LAB — TROPONIN I (HIGH SENSITIVITY): Troponin I (High Sensitivity): 3 ng/L (ref <18)

## 2022-04-17 LAB — D-DIMER, QUANTITATIVE: D-Dimer, Quant: 0.29 ug/mL-FEU (ref 0.00–0.50)

## 2022-04-17 MED ORDER — DICLOFENAC SODIUM 1 % EX GEL
2.0000 g | Freq: Four times a day (QID) | CUTANEOUS | 1 refills | Status: AC
Start: 2022-04-17 — End: ?

## 2022-04-17 MED ORDER — ACETAMINOPHEN 325 MG PO TABS
650.0000 mg | ORAL_TABLET | Freq: Once | ORAL | Status: AC
  Administered 2022-04-17: 650 mg via ORAL
  Filled 2022-04-17: qty 2

## 2022-04-17 NOTE — ED Provider Notes (Signed)
[facility]-EMERGENCY DEPT Provider Note   CSN: [id number] Arrival date & time: 04/16/22  2050     History  Chief Complaint  Patient presents with   Chest Pain   Abdominal Pain    Kristin Elliott is a 46 y.o. female.  The history is provided by the patient.  Chest Pain Associated symptoms: abdominal pain   Abdominal Pain Associated symptoms: chest pain   She has history of lupus, schizoaffective disorder, GERD and comes in because of chest pain and palpitations which started tonight.  She states she feels like her chest is racing and discomfort is in the midsternal area with radiation to the left arm.  There is associated dyspnea with exertion as well as nausea and diaphoresis.  Pain is somewhat worse with a deep breath.  She has taken acetaminophen without relief.  She is a non-smoker and denies history of hypertension, diabetes, hyperlipidemia.  She denies history of premature coronary atherosclerosis.  She denies history of recent surgery, travel.  There is no history of DVT or pulmonary embolism.  She is not on any anticoagulants.   Home Medications Prior to Admission medications   Medication Sig Start Date End Date Taking? Authorizing Provider  acetaminophen (TYLENOL) 325 MG tablet Take 2 tablets (650 mg total) by mouth every 6 (six) hours as needed for mild pain or moderate pain. 03/28/21   Arrien, York Ram, MD  albuterol (VENTOLIN HFA) 108 (90 Base) MCG/ACT inhaler Inhale 2 puffs into the lungs every 6 (six) hours as needed for wheezing or shortness of breath. 12/19/20   [provider]  ARIPiprazole ER (ABILIFY MAINTENA) 400 MG SRER injection Inject 400 mg into the muscle every 28 (twenty-eight) days.    [provider]  DULoxetine (CYMBALTA) 20 MG capsule Take by mouth. 04/09/22   [provider]  famotidine (PEPCID) 20 MG tablet Take 1 tablet (20 mg total) by mouth 2 (two) times daily. 03/28/21 04/27/21  Arrien, York Ram, MD  fluticasone (FLONASE) 50 MCG/ACT nasal spray Place 1 spray into both nostrils daily. 04/09/22   [provider]  gabapentin (NEURONTIN) 100 MG capsule Take 1 capsule (100 mg total) by mouth 3 (three) times daily. 03/30/21   Mesner, Barbara Cower, MD  levothyroxine (SYNTHROID) 50 MCG tablet Take 1 tablet (50 mcg total) by mouth daily at 6 (six) AM. 03/29/21 04/28/21  Arrien, York Ram, MD  LORazepam (ATIVAN) 1 MG tablet Take 0.5 tablets (0.5 mg total) by mouth every 6 (six) hours as needed for anxiety. 03/30/21   Mesner, Barbara Cower, MD  methocarbamol (ROBAXIN) 500 MG tablet Take 1 tablet (500 mg total) by mouth every 8 (eight) hours as needed for muscle spasms. 03/30/21   Mesner, Barbara Cower, MD  midodrine (PROAMATINE) 5 MG tablet Take 1 tablet (5 mg total) by mouth 2 (two) times daily with a meal. 02/27/21   [name], MD  Multiple Vitamins-Minerals (CERTAVITE/ANTIOXIDANTS) TABS Take 1 tablet by mouth daily. 03/28/21   Arrien, York Ram, MD      Allergies    Morphine and related, Doxycycline, Lasix [furosemide], Metoclopramide, Penicillins, Sulfa antibiotics, Toradol [ketorolac tromethamine], and Tramadol    Review of Systems   Review of Systems  Cardiovascular:  Positive for chest pain.  Gastrointestinal:  Positive for abdominal pain.  All other systems reviewed and are negative.   Physical Exam Updated Vital Signs BP 120/71   Pulse 60   Temp 98.1 F (36.7 C) (Oral)   Resp 17   Ht 5'  9" (1.753 m)   Wt 99.8 kg   SpO2 100%   BMI 32.49 kg/m  Physical Exam Vitals and nursing note reviewed.   46 year old female, resting comfortably and in no acute distress. Vital signs are normal. Oxygen saturation is 100%, which is normal. Head is normocephalic and atraumatic. PERRLA, EOMI. Oropharynx is clear. Neck is nontender and supple without adenopathy or JVD. Back is nontender and there is no CVA tenderness. Lungs are clear without rales, wheezes, or rhonchi. Chest is mildly  tender over the lower sternal and parasternal area bilaterally without crepitus. Heart has regular rate and rhythm without murmur or rub. Abdomen is soft, flat, nontender. Extremities have no cyanosis or edema, full range of motion is present. Skin is warm and dry without rash. Neurologic: Mental status is normal, cranial nerves are intact, moves all extremities equally.  ED Results / Procedures / Treatments   Labs (all labs ordered are listed, but only abnormal results are displayed) Labs Reviewed  BASIC METABOLIC PANEL - Abnormal; Notable for the following components:      Result Value   Chloride 112 (*)    CO2 18 (*)    Glucose, Bld 102 (*)    BUN 26 (*)    Creatinine, Ser 1.15 (*)    GFR, Estimated 59 (*)    All other components within normal limits  CBC - Abnormal; Notable for the following components:   RBC 3.61 (*)    Hemoglobin 11.0 (*)    HCT 32.7 (*)    All other components within normal limits  D-DIMER, QUANTITATIVE  TROPONIN I (HIGH SENSITIVITY)  TROPONIN I (HIGH SENSITIVITY)    EKG EKG Interpretation  Date/Time:  Tuesday April 16 2022 21:04:31 EDT Ventricular Rate:  61 PR Interval:  179 QRS Duration: 98 QT Interval:  421 QTC Calculation: 424 R Axis:   55 Text Interpretation: ATRIAL PACED RHYTHM Atrial premature complex When compared with ECG of 03/30/2021, T wave abnormality is no longer present Reconfirmed by Dione Booze ([id number]) on 04/17/2022 1:20:52 AM  Radiology DG Chest 2 View  Result Date: 04/16/2022 CLINICAL DATA:  Chest pain for 1 day EXAM: CHEST - 2 VIEW COMPARISON:  04/04/2022 FINDINGS: Cardiac shadow is within normal limits. Pacing device is again seen. Lungs are clear bilaterally. No bony abnormality is noted. IMPRESSION: No active cardiopulmonary disease. Electronically Signed   By: Alcide Clever M.D.   On: 04/16/2022 21:26    Procedures Procedures  Cardiac monitor shows atrial paced rhythm with normal rate, per my  interpretation.  Medications Ordered in ED Medications - No data to display  ED Course/ Medical Decision Making/ A&P                           Medical Decision Making Amount and/or Complexity of Data Reviewed Labs: ordered. Radiology: ordered.  Risk OTC drugs.   Chest pain which seems most likely to be musculoskeletal.  Differential diagnosis does include ACS, pericarditis, pulmonary embolism, pneumonia.  No evidence of arrhythmia.  Patient states she feels her heart is racing now even though heart rate is in the lower 60s.  I have ordered a chest x-ray to rule out pneumonia, ECG, CBC, basic metabolic panel, troponin x2, D-dimer.  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  I reviewed and interpreted her ECG and my interpretation is atrial paced rhythm without any ischemic changes.  I have reviewed and interpreted her laboratory tests  and my interpretation is mild anemia which is unchanged from baseline, mild renal insufficiency which is also unchanged from baseline.  Normal initial troponin with repeat troponin pending.  D-dimer is pending.  Chest x-ray shows no active cardiopulmonary disease.  I have independently viewed the images, and agree with the radiologist's interpretation.  Old records are reviewed, and on 03/27/2021 she had a coronary CT scan showing normal coronary arteries and calcium score of 0.  Heart score is 2 which puts her at low risk for major adverse cardiac events in the next 30 days.  I have reviewed and interpreted additional laboratory test, and repeat troponin is normal and D-dimer is normal-no evidence of ACS or pulmonary embolism.  She is felt to be safe for discharge.  Since she cannot tolerate oral NSAIDs.  I am giving her a prescription for diclofenac gel.  She is advised to apply ice and take acetaminophen as needed for pain.  Return precautions discussed.  Final Clinical Impression(s) / ED Diagnoses Final diagnoses:   Musculoskeletal chest pain  Normochromic normocytic anemia    Rx / DC Orders ED Discharge Orders          Ordered    diclofenac Sodium (VOLTAREN ARTHRITIS PAIN) 1 % GEL  4 times daily        04/17/22 0404              Dione Booze, MD 04/17/22 [phone]

## 2022-04-17 NOTE — Discharge Instructions (Signed)
Apply ice for 30 minutes at a time, 4 times a day.  You may take acetaminophen as needed for pain.  Return if you have any new or concerning symptoms.

## 2022-04-22 ENCOUNTER — Emergency Department (HOSPITAL_COMMUNITY): Payer: Medicare Other

## 2022-04-22 ENCOUNTER — Emergency Department (HOSPITAL_COMMUNITY)
Admission: EM | Admit: 2022-04-22 | Discharge: 2022-04-22 | Disposition: A | Discharge status: Home / Self Care | Payer: Medicare Other | Attending: Emergency Medicine | Admitting: Emergency Medicine

## 2022-04-22 DIAGNOSIS — Z20822 Contact with and (suspected) exposure to covid-19: Secondary | ICD-10-CM | POA: Diagnosis not present

## 2022-04-22 DIAGNOSIS — R072 Precordial pain: Secondary | ICD-10-CM | POA: Diagnosis not present

## 2022-04-22 DIAGNOSIS — R040 Epistaxis: Secondary | ICD-10-CM | POA: Insufficient documentation

## 2022-04-22 DIAGNOSIS — R0602 Shortness of breath: Secondary | ICD-10-CM | POA: Insufficient documentation

## 2022-04-22 DIAGNOSIS — Z87891 Personal history of nicotine dependence: Secondary | ICD-10-CM | POA: Insufficient documentation

## 2022-04-22 DIAGNOSIS — I10 Essential (primary) hypertension: Secondary | ICD-10-CM | POA: Diagnosis not present

## 2022-04-22 DIAGNOSIS — R079 Chest pain, unspecified: Secondary | ICD-10-CM

## 2022-04-22 LAB — CBC WITH DIFFERENTIAL/PLATELET
Abs Immature Granulocytes: 0.01 10*3/uL (ref 0.00–0.07)
Basophils Absolute: 0 10*3/uL (ref 0.0–0.1)
Basophils Relative: 0 %
Eosinophils Absolute: 0.1 10*3/uL (ref 0.0–0.5)
Eosinophils Relative: 1 %
HCT: 31.3 % — ABNORMAL LOW (ref 36.0–46.0)
Hemoglobin: 10.5 g/dL — ABNORMAL LOW (ref 12.0–15.0)
Immature Granulocytes: 0 %
Lymphocytes Relative: 18 %
Lymphs Abs: 1.1 10*3/uL (ref 0.7–4.0)
MCH: 29.7 pg (ref 26.0–34.0)
MCHC: 33.5 g/dL (ref 30.0–36.0)
MCV: 88.4 fL (ref 80.0–100.0)
Monocytes Absolute: 0.5 10*3/uL (ref 0.1–1.0)
Monocytes Relative: 9 %
Neutro Abs: 4.2 10*3/uL (ref 1.7–7.7)
Neutrophils Relative %: 72 %
Platelets: 200 10*3/uL (ref 150–400)
RBC: 3.54 MIL/uL — ABNORMAL LOW (ref 3.87–5.11)
RDW: 13.8 % (ref 11.5–15.5)
WBC: 5.9 10*3/uL (ref 4.0–10.5)
nRBC: 0 % (ref 0.0–0.2)

## 2022-04-22 LAB — I-STAT BETA HCG BLOOD, ED (MC, WL, AP ONLY): I-stat hCG, quantitative: <5 m[IU]/mL (ref <5)

## 2022-04-22 LAB — LIPASE, BLOOD: Lipase: 30 U/L (ref 11–51)

## 2022-04-22 LAB — BASIC METABOLIC PANEL
Anion gap: 11 (ref 5–15)
BUN: 17 mg/dL (ref 6–20)
CO2: 22 mmol/L (ref 22–32)
Calcium: 9.2 mg/dL (ref 8.9–10.3)
Chloride: 109 mmol/L (ref 98–111)
Creatinine, Ser: 1.31 mg/dL — ABNORMAL HIGH (ref 0.44–1.00)
GFR, Estimated: 50 mL/min — ABNORMAL LOW (ref >60)
Glucose, Bld: 82 mg/dL (ref 70–99)
Potassium: 3.3 mmol/L — ABNORMAL LOW (ref 3.5–5.1)
Sodium: 142 mmol/L (ref 135–145)

## 2022-04-22 LAB — BRAIN NATRIURETIC PEPTIDE: B Natriuretic Peptide: 56.6 pg/mL (ref 0.0–100.0)

## 2022-04-22 LAB — SARS CORONAVIRUS 2 BY RT PCR: SARS Coronavirus 2 by RT PCR: NEGATIVE

## 2022-04-22 LAB — TROPONIN I (HIGH SENSITIVITY): Troponin I (High Sensitivity): 9 ng/L (ref <18)

## 2022-04-22 NOTE — ED Provider Notes (Signed)
[facility] EMERGENCY DEPARTMENT Provider Note   CSN: [id number] Arrival date & time: 04/22/22  1513     History Chief Complaint  Patient presents with   Chest Pain   Shortness of Breath    HPI Kristin Elliott is a 46 y.o. female presenting for episodic chest pain and shortness of breath over the last 3 days.  She denies fevers or chills nausea vomiting syncope shortness of breath.  She endorses substernal left-sided sharp chest pain.  She has a history of hypertension but does not take any of her medications.  She also endorses recurrent nosebleeding and skin picking.  She states has been picking at her nose.  No current symptoms.  Patient resting comfortably in bed.   Patient's recorded medical, surgical, social, medication list and allergies were reviewed in the Snapshot window as part of the initial history.   Review of Systems   Review of Systems  Constitutional:  Negative for chills and fever.  HENT:  Negative for ear pain and sore throat.   Eyes:  Negative for pain and visual disturbance.  Respiratory:  Negative for cough and shortness of breath.   Cardiovascular:  Positive for chest pain. Negative for palpitations.  Gastrointestinal:  Negative for abdominal pain and vomiting.  Genitourinary:  Negative for dysuria and hematuria.  Musculoskeletal:  Negative for arthralgias and back pain.  Skin:  Negative for color change and rash.  Neurological:  Negative for seizures and syncope.  All other systems reviewed and are negative.   Physical Exam Updated Vital Signs BP 109/68   Pulse 60   Temp 98.7 F (37.1 C)   Resp 20   SpO2 100%  Physical Exam Vitals and nursing note reviewed.  Constitutional:      General: She is not in acute distress.    Appearance: She is well-developed.  HENT:     Head: Normocephalic and atraumatic.  Eyes:     Conjunctiva/sclera: Conjunctivae normal.  Cardiovascular:     Rate and Rhythm: Normal rate and regular  rhythm.     Heart sounds: No murmur heard. Pulmonary:     Effort: Pulmonary effort is normal. No respiratory distress.     Breath sounds: Normal breath sounds.  Abdominal:     General: There is no distension.     Palpations: Abdomen is soft.     Tenderness: There is no abdominal tenderness. There is no right CVA tenderness or left CVA tenderness.  Musculoskeletal:        General: No swelling or tenderness. Normal range of motion.     Cervical back: Neck supple.  Skin:    General: Skin is warm and dry.  Neurological:     General: No focal deficit present.     Mental Status: She is alert and oriented to person, place, and time. Mental status is at baseline.     Cranial Nerves: No cranial nerve deficit.      ED Course/ Medical Decision Making/ A&P Clinical Course as of 04/22/22 2001  Mon Apr 22, 2022  9163 DC ready [CC]    Clinical Course User Index [CC] Glyn Ade, MD    Procedures Procedures   Medications Ordered in ED Medications - No data to display Medical Decision Making:  Kristin Elliott is a 46 y.o. female who presented to the ED today with chest pain, detailed above.  Based on patient's comorbidities, patient has a heart score of 2.     Patient's presentation is complicated by  their history of obesity, smoking.  Patient placed on continuous vitals and telemetry monitoring while in ED which was reviewed periodically.   Complete initial physical exam performed, notably the patient was hemodynamically stable in no acute distress.    Reviewed and confirmed nursing documentation for past medical history, family history, social history.       Initial Assessment:  With the patient's presentation of left-sided chest pain, most likely diagnosis is musculoskeletal chest pain versus GERD, although ACS remains on the differential. Other diagnoses were considered including (but not limited to) pulmonary embolism, community-acquired pneumonia, aortic  dissection, pneumothorax, underlying bony abnormality, anemia. These are considered less likely due to history of present illness and physical exam findings.     In particular, concerning pulmonary embolism: Patient is PERC negative and the they deny malignancy, recent surgery, history of DVT, or calf tenderness leading to a low risk Wells score.  Aortic Dissection also reconsidered but seems less likely based on the location, quality, onset, and severity of symptoms in this case. Patient also has a lack of underlying history of AD or TAA. Additionally, physical exam is without a difference of more than in blood pressure between the arms.   This is most consistent with an acute life/limb threatening illness complicated by underlying chronic conditions.   Initial Plan:  Evaluate for ACS with single troponin and EKG evaluated as below  Evaluate for dissection, bony abnormality, or pneumonia with chest x-ray and screening laboratory evaluation including CBC, BMP  Further evaluation for pulmonary embolism not indicated at this time based on patient's PERC and Wells score.  Further evaluation for Thoracic Aortic Dissection not indicated at this time based on patient's clinical history and PE findings.          Initial Study Results:  EKG was reviewed independently. Rate, rhythm, axis, intervals all examined and without medically relevant abnormality. ST segments without concerns for elevations.     Laboratory   Single troponin demonstrated no acute abnormalities.  Given duration of patient's symptoms, greater than 6 hours, no indication for further interventional evaluation.  CBC and BMP without obvious metabolic or inflammatory abnormalities requiring further evaluation      Radiology   DG Chest 2 View  Final Result     DG Chest 2 View  Result Date: 04/22/2022 CLINICAL DATA:  Chest pain. EXAM: CHEST - 2 VIEW COMPARISON:  04/16/2022 FINDINGS: Left-sided pacemaker in place.  The cardiomediastinal contours are normal. There is a hiatal hernia. Lungs are clear. Pulmonary vasculature is normal. No consolidation, pleural effusion, or pneumothorax. No acute osseous abnormalities are seen. IMPRESSION: 1. No acute chest findings. 2. Hiatal hernia. Electronically Signed   By: Narda Rutherford M.D.   On: 04/22/2022 16:05   DG Chest 2 View  Result Date: 04/16/2022 CLINICAL DATA:  Chest pain for 1 day EXAM: CHEST - 2 VIEW COMPARISON:  04/04/2022 FINDINGS: Cardiac shadow is within normal limits. Pacing device is again seen. Lungs are clear bilaterally. No bony abnormality is noted. IMPRESSION: No active cardiopulmonary disease. Electronically Signed   By: Alcide Clever M.D.   On: 04/16/2022 21:26       Final Assessment and Plan:    Patient's history of present illness and physical exam findings are without any acute pathology appreciated.  Uncertain underlying etiology.  This may be related to heartburn in the setting of hiatal hernia though remains nonspecific.  Recommended close follow-up with PCP in the outpatient setting and gave patient information for coordinating this  care.  Patient informed of importance of stopping smoking and following closely with primary care provider in the outpatient setting.  Patient ambulatory tolerating p.o. intake at this time in no acute distress.           Clinical Impression: No diagnosis found.   Data Unavailable   Final Clinical Impression(s) / ED Diagnoses Final diagnoses:  None    Rx / DC Orders ED Discharge Orders     None         Glyn Ade, MD 04/22/22 2008

## 2022-04-22 NOTE — Discharge Instructions (Addendum)
You are seen today for nonspecific intermittent chest pain, nosebleeding, other abnormalities over the past 3 days.  We are uncertain what is causing your symptoms, but it does not appear to be an emergent condition.  We recommend you follow-up closely your primary care provider using the attached phone numbers within 48 hours for reassessment.  Thank for the opportunity participate in her care, please return with any changes in your symptoms.

## 2022-04-22 NOTE — ED Provider Triage Note (Signed)
Emergency Medicine Provider Triage Evaluation Note  Kristin Elliott , a 46 y.o. female  was evaluated in triage.  Pt complains of chest pain, shortness of breath and generalized weakness.  Chest pain started today, central and radiates to her left arm.  Associated with shortness of breath, nausea and vomiting.  Denies any specific abdominal pain, states she just feels "weak all over"..  Review of Systems  Per hpi  Physical Exam  BP 118/82 (BP Location: Right Arm)   Pulse 71   Temp 98.9 F (37.2 C) (Oral)   Resp 16   SpO2 99%  Gen:   Awake, no distress   Resp:  Normal effort  MSK:   Moves extremities without difficulty  Other:    Medical Decision Making  Medically screening exam initiated at 3:28 PM.  Appropriate orders placed.  Kristin Elliott was informed that the remainder of the evaluation will be completed by another provider, this initial triage assessment does not replace that evaluation, and the importance of remaining in the ED until their evaluation is complete.     Kristin Arista, PA-C 04/22/22 1528

## 2022-04-22 NOTE — ED Triage Notes (Signed)
Pt c/o "sharp" CP w associated nausea, poor PO, SHOB, "swelling in nostrils that happens w bleeding, if I don't pick it out I can't breathe."

## 2022-05-15 IMAGING — MR MR HEAD WO/W CM
14 of 16 series · 42 of 48 positions shown · IV contrast (gadavist)
Comparison: Head CT 03/24/2021

CLINICAL DATA: Headache, new or worsening, neuro deficit (Age
19-49y).

EXAM:
MRI HEAD WITHOUT AND WITH CONTRAST
TECHNIQUE: Multiplanar, multiecho pulse sequences of the brain and surrounding
structures were obtained without and with intravenous contrast.
CONTRAST:  10mL GADAVIST GADOBUTROL 1 MMOL/ML IV SOLN

[Series 5: DWI · axial · 3.0mm · 0.92mm/px · z∈[-58,+88]mm · 8 of 100 slices shown (1 of 4)]
[im 1/100]
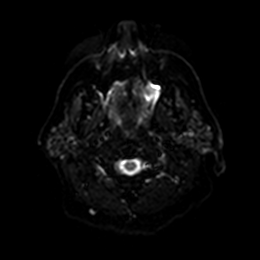
[im 15/100]
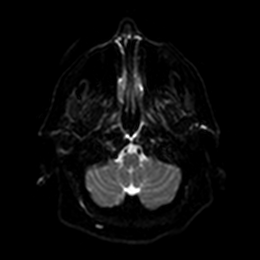
[im 29/100]
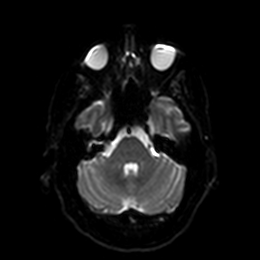
[im 43/100]
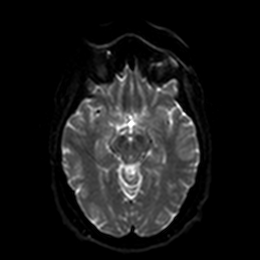
[im 57/100]
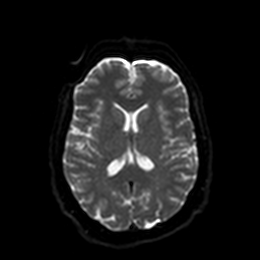
[im 71/100]
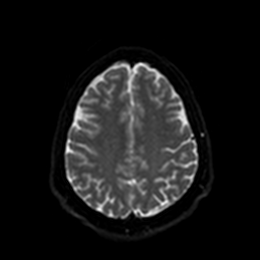
[im 85/100]
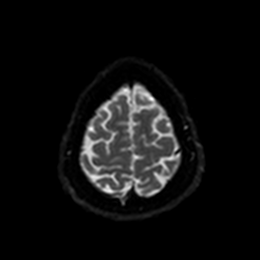
[im 100/100]
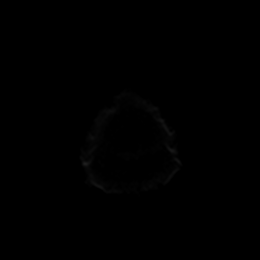

[Series 6: DWI · axial · 3.0mm · 0.92mm/px · z∈[-58,+88]mm · 3 of 50 slices shown (2 of 4)]
[im 1/50]
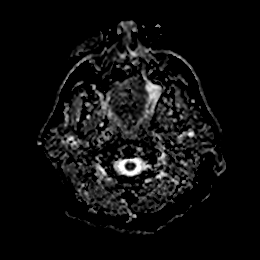
[im 25/50]
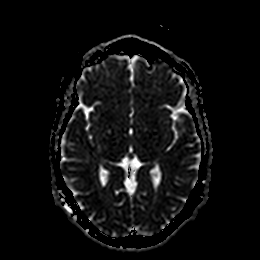
[im 50/50]
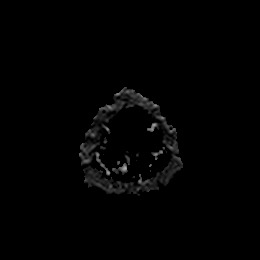

[Series 7: DWI · coronal · 4.0mm · 0.88mm/px · 5 of 70 slices shown (3 of 4)]
[im 1/70]
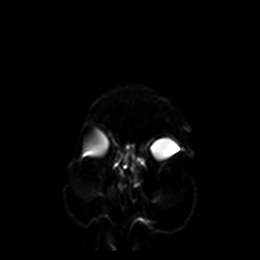
[im 18/70]
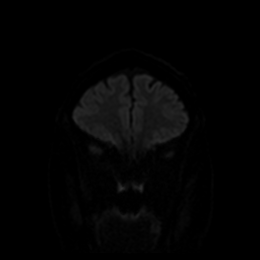
[im 35/70]
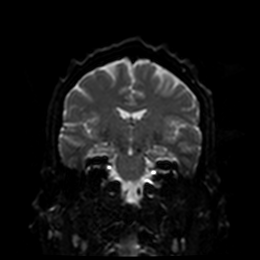
[im 52/70]
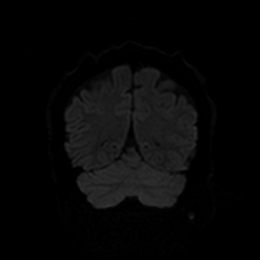
[im 70/70]
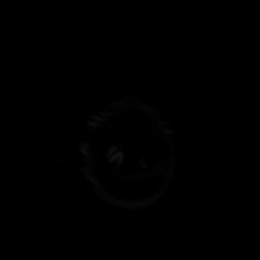

[Series 8: DWI · coronal · 4.0mm · 0.88mm/px · 2 of 35 slices shown (4 of 4)]
[im 1/35]
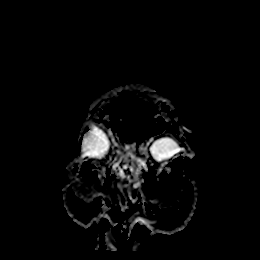
[im 35/35]
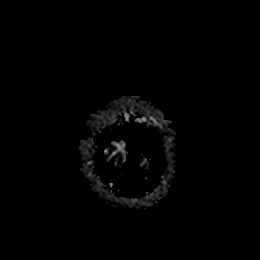

[Series 9: T1 · sagittal · 5.0mm · 0.75mm/px · 2 of 25 slices shown]
[im 1/25]
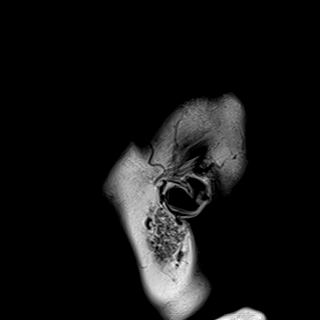
[im 25/25]
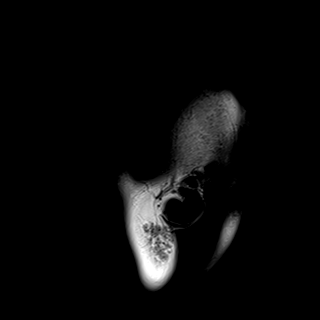

[Series 10: T2 · axial · 5.0mm · 0.72mm/px · z∈[-56,+87]mm · 2 of 25 slices shown]
[im 1/25]
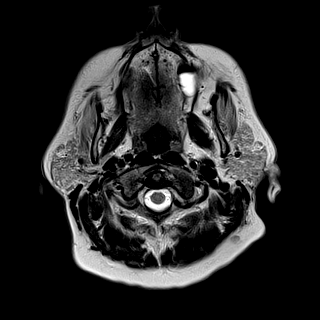
[im 25/25]
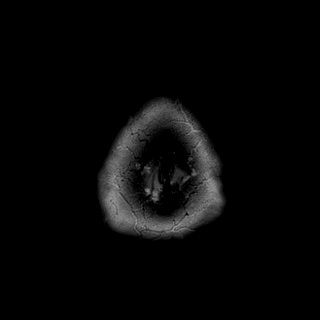

[Series 11: FLAIR · axial · 5.0mm · 0.45mm/px · z∈[-55,+89]mm · 2 of 25 slices shown]
[im 1/25]
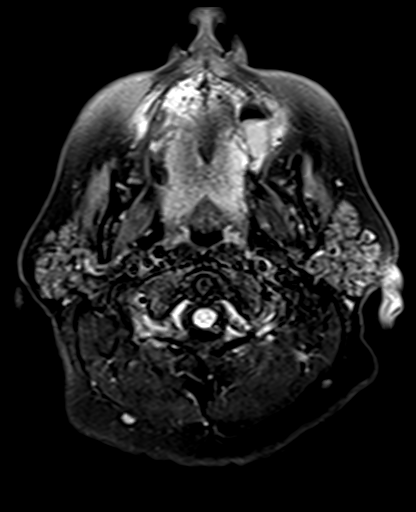
[im 25/25]
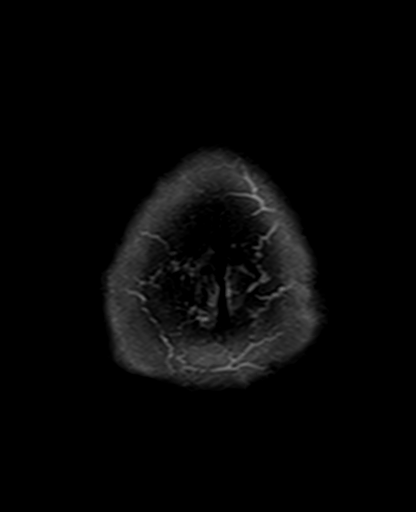

[Series 12: mag_images · axial · 3.0mm · 0.90mm/px · z∈[-59,+93]mm · 3 of 52 slices shown]
[im 1/52]
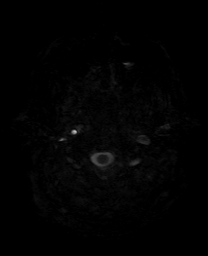
[im 26/52]
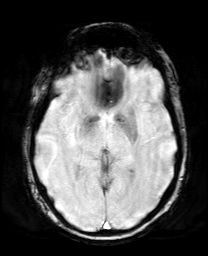
[im 52/52]
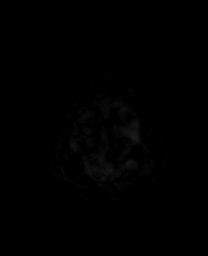

[Series 13: pha_images · axial · 3.0mm · 0.90mm/px · z∈[-59,+93]mm · 3 of 52 slices shown]
[im 1/52]
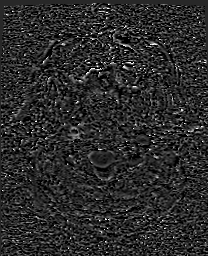
[im 26/52]
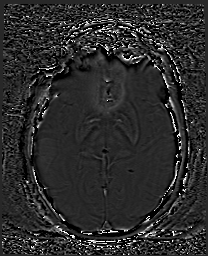
[im 52/52]
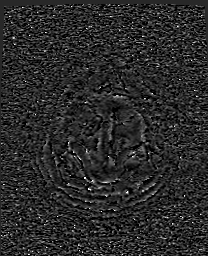

[Series 14: swi_images · axial · 3.0mm · 0.90mm/px · z∈[-59,+93]mm · 3 of 52 slices shown]
[im 1/52]
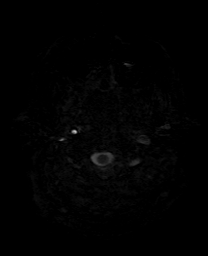
[im 26/52]
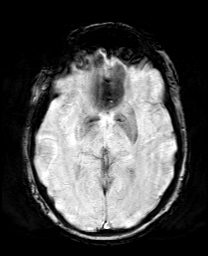
[im 52/52]
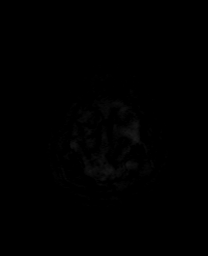

[Series 15: mip_images(sw) · axial · 24.0mm · 0.90mm/px · z∈[-49,+83]mm · 3 of 45 slices shown]
[im 1/45]
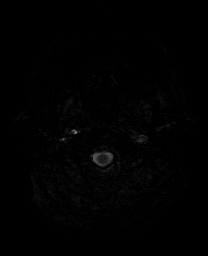
[im 23/45]
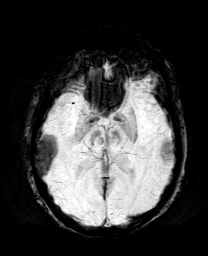
[im 45/45]
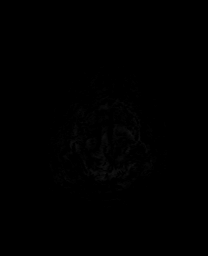

[Series 17: T2 post-contrast · coronal · 5.0mm · 0.72mm/px · 2 of 30 slices shown]
[im 1/30]
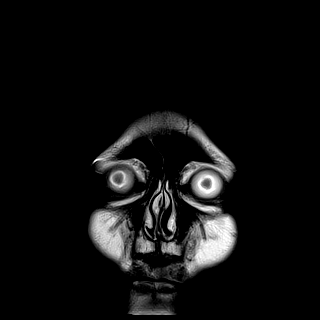
[im 30/30]
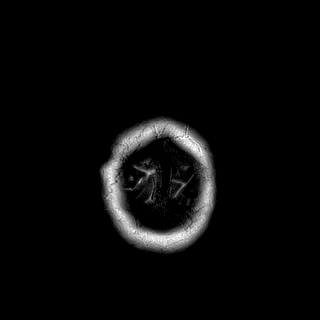

[Series 19: T1 post-contrast · coronal · 5.0mm · 0.34mm/px · 2 of 30 slices shown (1 of 2)]
[im 1/30]
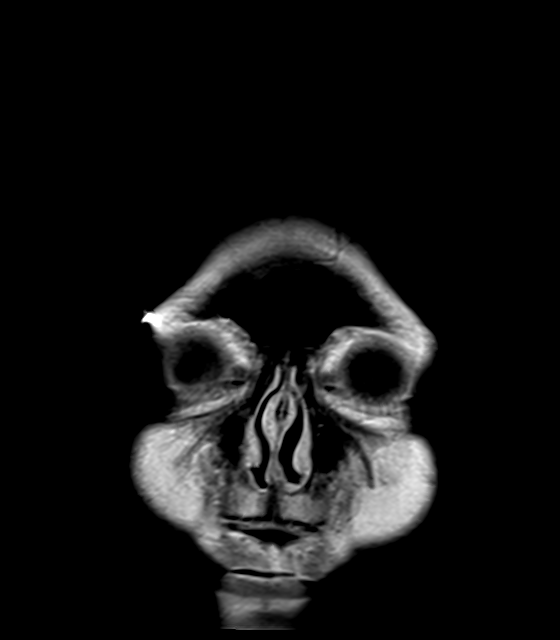
[im 30/30]
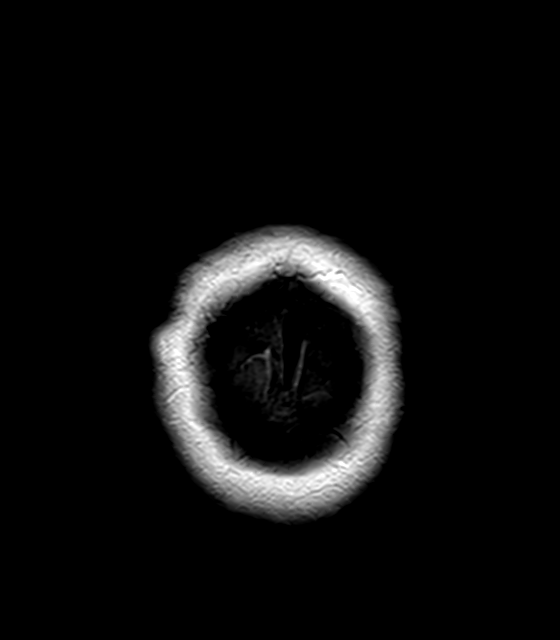

[Series 20: T1 post-contrast · sagittal · 5.0mm · 0.75mm/px · 2 of 25 slices shown (2 of 2)]
[im 1/25]
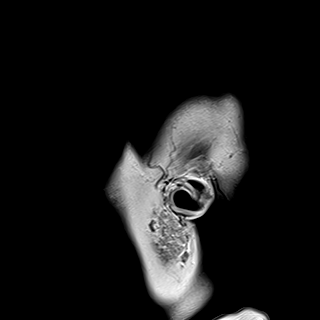
[im 25/25]
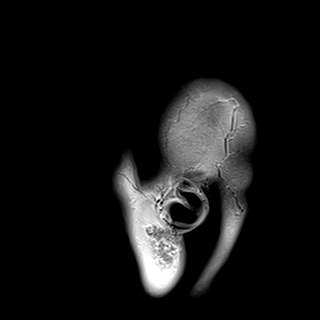

[42 of 48 positions shown; findings below may reference images not displayed]

FINDINGS: Brain: There is no evidence of an acute infarct, intracranial
hemorrhage, midline shift, or extra-axial fluid collection. The
ventricles and sulci are normal. There is a single small focus of T2
FLAIR hyperintensity in the subcortical white matter of the lateral
right frontal lobe, nonspecific and likely of no clinical
significance.

On sagittal images, the pituitary gland appears asymmetrically
enlarged on the left with a height of 8 mm, slightly convex superior
margin, and mild depression of the left sellar floor, however the
gland appears more symmetric on coronal images aside from left
eccentric positioning related to the sellar floor depression. The
infundibulum inserts into the central aspect of the pituitary gland
(slightly left of midline) with lack of rightward displacement
arguing against an underlying left-sided mass. Pituitary gland
enhancement is homogeneous on this nondedicated study, and the gland
also demonstrates grossly homogeneous T2 signal. The overall size of
the gland is within normal limits for a female of this age.

Vascular: Major intracranial vascular flow voids are preserved.

Skull and upper cervical spine: Unremarkable bone marrow signal.

Sinuses/Orbits: Unremarkable orbits. Mild mucosal thickening in the
maxillary sinuses. No significant mastoid fluid.

Other: None.
IMPRESSION: 1. No acute intracranial abnormality.
2. Pituitary/sellar asymmetry as detailed above. This may reflect a
mildly prominent but normal gland for a female of this age with
developmental asymmetry of the sellar floor, or alternatively there
could be an underlying pituitary adenoma although one is not clearly
demonstrated on this nondedicated study.
3. Otherwise largely unremarkable appearance of the brain.
